# Patient Record
Sex: Female | Born: 1975 | Race: White | Hispanic: No | Marital: Single | Smoking: Never smoker
Health system: Southern US, Community
[De-identification: ages and names within clinical notes are randomized; demographics above are authoritative.]

## PROBLEM LIST (undated history)

## (undated) DIAGNOSIS — R519 Headache, unspecified: Secondary | ICD-10-CM

## (undated) DIAGNOSIS — D352 Benign neoplasm of pituitary gland: Secondary | ICD-10-CM

## (undated) DIAGNOSIS — F32A Depression, unspecified: Secondary | ICD-10-CM

## (undated) DIAGNOSIS — K648 Other hemorrhoids: Secondary | ICD-10-CM

## (undated) DIAGNOSIS — D353 Benign neoplasm of craniopharyngeal duct: Secondary | ICD-10-CM

## (undated) DIAGNOSIS — H60509 Unspecified acute noninfective otitis externa, unspecified ear: Secondary | ICD-10-CM

## (undated) DIAGNOSIS — R51 Headache: Secondary | ICD-10-CM

## (undated) DIAGNOSIS — E079 Disorder of thyroid, unspecified: Secondary | ICD-10-CM

## (undated) DIAGNOSIS — H6120 Impacted cerumen, unspecified ear: Secondary | ICD-10-CM

## (undated) DIAGNOSIS — B351 Tinea unguium: Secondary | ICD-10-CM

## (undated) DIAGNOSIS — I6350 Cerebral infarction due to unspecified occlusion or stenosis of unspecified cerebral artery: Secondary | ICD-10-CM

## (undated) DIAGNOSIS — E119 Type 2 diabetes mellitus without complications: Secondary | ICD-10-CM

## (undated) DIAGNOSIS — F329 Major depressive disorder, single episode, unspecified: Secondary | ICD-10-CM

## (undated) DIAGNOSIS — Z8701 Personal history of pneumonia (recurrent): Secondary | ICD-10-CM

## (undated) HISTORY — DX: Headache, unspecified: R51.9

## (undated) HISTORY — DX: Type 2 diabetes mellitus without complications: E11.9

## (undated) HISTORY — DX: Disorder of thyroid, unspecified: E07.9

## (undated) HISTORY — DX: Tinea unguium: B35.1

## (undated) HISTORY — DX: Other hemorrhoids: K64.8

## (undated) HISTORY — DX: Benign neoplasm of pituitary gland: D35.2

## (undated) HISTORY — DX: Impacted cerumen, unspecified ear: H61.20

## (undated) HISTORY — DX: Benign neoplasm of craniopharyngeal duct: D35.3

## (undated) HISTORY — DX: Depression, unspecified: F32.A

## (undated) HISTORY — DX: Major depressive disorder, single episode, unspecified: F32.9

## (undated) HISTORY — DX: Cerebral infarction due to unspecified occlusion or stenosis of unspecified cerebral artery: I63.50

## (undated) HISTORY — DX: Unspecified acute noninfective otitis externa, unspecified ear: H60.509

## (undated) HISTORY — DX: Personal history of pneumonia (recurrent): Z87.01

## (undated) HISTORY — DX: Headache: R51

---
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
## 1998-02-18 ENCOUNTER — Encounter: Admission: RE | Admit: 1998-02-18 | Discharge: 1998-05-19 | Payer: Self-pay | Admitting: Neurology

## 2001-03-22 ENCOUNTER — Encounter: Payer: Self-pay | Admitting: Endocrinology

## 2001-03-22 ENCOUNTER — Ambulatory Visit (HOSPITAL_COMMUNITY): Admission: RE | Admit: 2001-03-22 | Discharge: 2001-03-22 | Payer: Self-pay | Admitting: Endocrinology

## 2002-05-02 ENCOUNTER — Encounter (INDEPENDENT_AMBULATORY_CARE_PROVIDER_SITE_OTHER): Payer: Self-pay | Admitting: Specialist

## 2002-05-02 ENCOUNTER — Ambulatory Visit (HOSPITAL_COMMUNITY): Admission: RE | Admit: 2002-05-02 | Discharge: 2002-05-03 | Payer: Self-pay | Admitting: Otolaryngology

## 2002-07-09 ENCOUNTER — Encounter: Payer: Self-pay | Admitting: Preventative Medicine

## 2002-07-09 ENCOUNTER — Ambulatory Visit (HOSPITAL_COMMUNITY): Admission: RE | Admit: 2002-07-09 | Discharge: 2002-07-09 | Payer: Self-pay | Admitting: Preventative Medicine

## 2004-04-12 ENCOUNTER — Ambulatory Visit (HOSPITAL_COMMUNITY): Admission: RE | Admit: 2004-04-12 | Discharge: 2004-04-12 | Payer: Self-pay | Admitting: Endocrinology

## 2005-05-29 ENCOUNTER — Ambulatory Visit: Payer: Self-pay | Admitting: Family Medicine

## 2006-02-01 ENCOUNTER — Ambulatory Visit: Payer: Self-pay | Admitting: Obstetrics & Gynecology

## 2006-02-01 ENCOUNTER — Encounter: Payer: Self-pay | Admitting: Obstetrics & Gynecology

## 2006-02-26 ENCOUNTER — Ambulatory Visit: Payer: Self-pay | Admitting: Family Medicine

## 2006-03-14 ENCOUNTER — Ambulatory Visit (HOSPITAL_COMMUNITY): Admission: RE | Admit: 2006-03-14 | Discharge: 2006-03-14 | Payer: Self-pay | Admitting: Endocrinology

## 2006-06-19 ENCOUNTER — Ambulatory Visit: Payer: Self-pay | Admitting: Internal Medicine

## 2006-11-22 DIAGNOSIS — D352 Benign neoplasm of pituitary gland: Secondary | ICD-10-CM

## 2006-11-22 DIAGNOSIS — D353 Benign neoplasm of craniopharyngeal duct: Secondary | ICD-10-CM

## 2006-11-22 DIAGNOSIS — I6350 Cerebral infarction due to unspecified occlusion or stenosis of unspecified cerebral artery: Secondary | ICD-10-CM | POA: Insufficient documentation

## 2006-11-22 DIAGNOSIS — Z8709 Personal history of other diseases of the respiratory system: Secondary | ICD-10-CM | POA: Insufficient documentation

## 2007-05-07 ENCOUNTER — Ambulatory Visit: Payer: Self-pay | Admitting: Family Medicine

## 2007-05-07 DIAGNOSIS — B351 Tinea unguium: Secondary | ICD-10-CM | POA: Insufficient documentation

## 2007-05-07 DIAGNOSIS — H60509 Unspecified acute noninfective otitis externa, unspecified ear: Secondary | ICD-10-CM

## 2007-05-07 LAB — CONVERTED CEMR LAB
ALT: 45 units/L — ABNORMAL HIGH (ref 0–35)
AST: 30 units/L (ref 0–37)

## 2007-05-08 ENCOUNTER — Encounter (INDEPENDENT_AMBULATORY_CARE_PROVIDER_SITE_OTHER): Payer: Self-pay | Admitting: *Deleted

## 2007-06-14 ENCOUNTER — Ambulatory Visit: Payer: Self-pay | Admitting: Family Medicine

## 2007-06-24 ENCOUNTER — Ambulatory Visit: Payer: Self-pay | Admitting: Family Medicine

## 2007-08-12 ENCOUNTER — Encounter: Payer: Self-pay | Admitting: Obstetrics & Gynecology

## 2007-08-12 ENCOUNTER — Ambulatory Visit: Payer: Self-pay | Admitting: Gynecology

## 2007-09-24 ENCOUNTER — Encounter: Admission: RE | Admit: 2007-09-24 | Discharge: 2007-09-24 | Payer: Self-pay | Admitting: Endocrinology

## 2008-05-21 ENCOUNTER — Ambulatory Visit: Payer: Self-pay | Admitting: Family Medicine

## 2008-08-28 LAB — HM PAP SMEAR

## 2008-09-29 ENCOUNTER — Ambulatory Visit (HOSPITAL_COMMUNITY): Admission: RE | Admit: 2008-09-29 | Discharge: 2008-09-29 | Payer: Self-pay | Admitting: Endocrinology

## 2009-02-25 ENCOUNTER — Ambulatory Visit: Payer: Self-pay | Admitting: Family Medicine

## 2009-02-25 DIAGNOSIS — H6120 Impacted cerumen, unspecified ear: Secondary | ICD-10-CM

## 2009-06-10 ENCOUNTER — Ambulatory Visit: Payer: Self-pay | Admitting: Family Medicine

## 2009-08-17 ENCOUNTER — Ambulatory Visit: Payer: Self-pay | Admitting: Gynecology

## 2009-08-17 ENCOUNTER — Other Ambulatory Visit: Admission: RE | Admit: 2009-08-17 | Discharge: 2009-08-17 | Payer: Self-pay | Admitting: Gynecology

## 2009-09-09 ENCOUNTER — Ambulatory Visit: Payer: Self-pay | Admitting: Family Medicine

## 2009-09-27 ENCOUNTER — Telehealth: Payer: Self-pay | Admitting: Family Medicine

## 2009-10-25 ENCOUNTER — Ambulatory Visit: Payer: Self-pay | Admitting: Internal Medicine

## 2009-12-07 ENCOUNTER — Ambulatory Visit: Payer: Self-pay | Admitting: Family Medicine

## 2010-03-01 ENCOUNTER — Ambulatory Visit: Payer: Self-pay | Admitting: Family Medicine

## 2010-05-20 ENCOUNTER — Ambulatory Visit: Payer: Self-pay | Admitting: Internal Medicine

## 2010-06-01 ENCOUNTER — Ambulatory Visit: Payer: Self-pay | Admitting: Family Medicine

## 2010-06-06 ENCOUNTER — Encounter: Admission: RE | Admit: 2010-06-06 | Discharge: 2010-06-06 | Payer: Self-pay | Admitting: Endocrinology

## 2010-06-15 ENCOUNTER — Ambulatory Visit: Payer: Self-pay | Admitting: Internal Medicine

## 2010-06-15 DIAGNOSIS — K648 Other hemorrhoids: Secondary | ICD-10-CM | POA: Insufficient documentation

## 2010-07-01 ENCOUNTER — Encounter: Payer: Self-pay | Admitting: Family Medicine

## 2010-07-26 ENCOUNTER — Encounter: Payer: Self-pay | Admitting: Family Medicine

## 2010-08-31 ENCOUNTER — Ambulatory Visit: Admit: 2010-08-31 | Payer: Self-pay | Admitting: Family Medicine

## 2010-09-06 ENCOUNTER — Other Ambulatory Visit: Payer: Self-pay | Admitting: Family Medicine

## 2010-09-06 ENCOUNTER — Ambulatory Visit
Admission: RE | Admit: 2010-09-06 | Discharge: 2010-09-06 | Payer: Self-pay | Source: Home / Self Care | Attending: Family Medicine | Admitting: Family Medicine

## 2010-09-06 DIAGNOSIS — R5383 Other fatigue: Secondary | ICD-10-CM

## 2010-09-06 DIAGNOSIS — R5381 Other malaise: Secondary | ICD-10-CM | POA: Insufficient documentation

## 2010-09-06 LAB — B12 AND FOLATE PANEL
Folate: 9.6 ng/mL
Vitamin B-12: 370 pg/mL (ref 211–911)

## 2010-09-06 LAB — CBC WITH DIFFERENTIAL/PLATELET
Basophils Absolute: 0 10*3/uL (ref 0.0–0.1)
Basophils Relative: 0.3 % (ref 0.0–3.0)
Eosinophils Absolute: 0.2 10*3/uL (ref 0.0–0.7)
Eosinophils Relative: 2.2 % (ref 0.0–5.0)
HCT: 41.9 % (ref 36.0–46.0)
Hemoglobin: 14.4 g/dL (ref 12.0–15.0)
Lymphocytes Relative: 38.1 % (ref 12.0–46.0)
Lymphs Abs: 3.9 10*3/uL (ref 0.7–4.0)
MCHC: 34.4 g/dL (ref 30.0–36.0)
MCV: 95.1 fl (ref 78.0–100.0)
Monocytes Absolute: 0.8 10*3/uL (ref 0.1–1.0)
Monocytes Relative: 7.5 % (ref 3.0–12.0)
Neutro Abs: 5.3 10*3/uL (ref 1.4–7.7)
Neutrophils Relative %: 51.9 % (ref 43.0–77.0)
Platelets: 248 10*3/uL (ref 150.0–400.0)
RBC: 4.41 Mil/uL (ref 3.87–5.11)
RDW: 12.2 % (ref 11.5–14.6)
WBC: 10.1 10*3/uL (ref 4.5–10.5)

## 2010-09-29 NOTE — Assessment & Plan Note (Signed)
Summary: LETVAK FLU SHOT/RBH  Nurse Visit   Allergies: 1)  ! Codeine  Immunizations Administered:  Influenza Vaccine # 1:    Vaccine Type: Fluvax 3+    Site: left deltoid    Mfr: GlaxoSmithKline    Dose: 0.5 ml    Route: IM    Given by: Mervin Hack CMA (AAMA)    Exp. Date: 02/25/2011    Lot #: [id number]    VIS given: 03/22/10 version given May 20, 2010.  Flu Vaccine Consent Questions:    Do you have a history of severe allergic reactions to this vaccine? no    Any prior history of allergic reactions to egg and/or gelatin? no    Do you have a sensitivity to the preservative Thimersol? no    Do you have a past history of Guillan-Barre Syndrome? no    Do you currently have an acute febrile illness? no    Have you ever had a severe reaction to latex? no    Vaccine information given and explained to patient? yes    Are you currently pregnant? no  Orders Added: 1)  Flu Vaccine 58yrs + [90658] 2)  Admin 1st Vaccine [09811]

## 2010-09-29 NOTE — Assessment & Plan Note (Signed)
Summary: HEMROIDS/DLO   Vital Signs:  Patient profile:   35 year old female Height:      67.5 inches Weight:      274.50 pounds BMI:     42.51 Temp:     98.2 degrees F oral Pulse rate:   80 / minute Pulse rhythm:   regular BP sitting:   110 / 70  (left arm) Cuff size:   large  Vitals Entered By: Selena Batten Dance CMA Duncan Dull) (June 15, 2010 10:36 AM) CC: Hemmorhoids   History of Present Illness: CC: hemorrhoids  never been told had hemorrhoids but pt thinks may be the case.  Last GYN visit no ext hemorrhoids seen.  last week started with hemorrhoid, very tender.  No bleeding.  Mom has been using tucks pads, wipes, preparation H.    Stools - pretty firm stools, goes every day.  2-3 stools/day.  Has firm balls with stool.  Allergies: 1)  ! 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  NAD Rectal:  inflammed prolapsed L internal hemorrhoid.  too tender to reduce.  not thrombosed.   Impression & Recommendations:  Problem # 1:  HEMORRHOIDS, INTERNAL (ICD-455.0) prolapsed.  Discussed could try to reduce, pt prefers more definitive treatment.  referral to surgery for evaluation.  rec colace daily, sitz baths and steroid suppository  Orders: Surgical Referral (Surgery)  Complete Medication List: 1)  Vytorin 10-40 Mg Tabs (Ezetimibe-simvastatin) .Marland Kitchen.. 1 by mouth once daily 2)  Cabergoline 0.5 Mg Tabs (Cabergoline) .... Mondays and thursdays 3)  Levothyroxine Sodium 150 Mcg Tabs (Levothyroxine sodium) .Marland Kitchen.. 1 by mouth once daily 4)  Fish Oil 1200 Mg Caps (Omega-3 fatty acids) .Marland Kitchen.. 1 by mouth two times a day 5)  Topamax 25 Mg Tabs (Topiramate) .... 2 by mouth every morning and 2 by  mouth each evening 6)  Verapamil Hcl Cr 240 Mg Cp24 (Verapamil hcl) .... 2 by mouth once daily 7)  Ec-naprosyn 500 Mg Tbec (Naproxen) .... As needed 8)  Nystatin 100000 Unit/gm Crea (Nystatin) .... Apply to affected area two times a day, continue for at least 48 hours after rash resolved 9)  Cymbalta 30 Mg Cpep (Duloxetine hcl) .... Take 1 by mouth once daily 10)  Anucort-hc 25 Mg Supp (Hydrocortisone acetate) .... Suppository twice daily for next week  Patient Instructions: 1)  start colace daily - goal is one soft stool a day.  More water, more fiber, consider benefiber or metamucil. 2)  We will send you to see the surgeon hopefully soon. 3)  Steroid cream for next few days as well as sitting in warm water bath. 4)  Good to meet you today, call clinic with questions. 5)  Pass by Marion's office for referral. Prescriptions: ANUCORT-HC 25 MG SUPP (HYDROCORTISONE ACETATE) suppository twice daily for next week  #1 x 0   Entered and Authorized by:   Eustaquio Boyden  MD   Signed by:   Eustaquio Boyden  MD on 06/15/2010   Method used:   Print then Give to Patient   RxID:   [id number]    Orders Added: 1)  Est. Patient Level III [32440] 2)  Surgical Referral [Surgery]    Current Allergies (  reviewed today): ! CODEINE

## 2010-09-29 NOTE — Assessment & Plan Note (Signed)
Summary: DEPO SHOT/CLE  Nurse Visit   Allergies: 1)  ! Codeine  Medication Administration  Injection # 1:    Medication: Depo-Provera 150mg     Diagnosis: CONTRACEPTIVE MANAGEMENT (ICD-V25.09)    Route: IM    Site: R deltoid    Exp Date: 09/28/2012    Lot #: [id number]    Mfr: Francisca December    Comments: Patient requested that the injection be given in her arm instead of buttocks.    Patient tolerated injection without complications    Given by: Linde Gillis CMA Duncan Dull) (March 01, 2010 12:44 PM)  Orders Added: 1)  Depo-Provera 150mg  [J1055] 2)  Admin of Therapeutic Inj  intramuscular or subcutaneous [47829]

## 2010-09-29 NOTE — Assessment & Plan Note (Signed)
Summary: EAR/CLE   Vital Signs:  Patient profile:   35 year old female Weight:      283 pounds Temp:     99.1 degrees F oral BP sitting:   120 / 80  (left arm) Cuff size:   large  Vitals Entered By: Mervin Hack CMA Duncan Dull) (October 25, 2009 12:32 PM) CC: left ear pain   History of Present Illness: Having left ear pain started last night mild fever sister tried left over ear drops and naproxen--able to get back to sleep  Does swim regularly tends to over use Q-tips  No discharge No cough or rhinorrhea No cold symptoms  Allergies: 1)  ! Codeine  Past History:  Past medical, surgical, family and social histories (including risk factors) reviewed for relevance to current acute and chronic problems.  Past Medical History: Reviewed history from 11/22/2006 and no changes required. Pneumonia, hx of  Past Surgical History: Reviewed history from 11/22/2006 and no changes required. 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  Social History: Reviewed history and no changes required.  Review of Systems       canker sore last week--better now No GI symptoms  Physical Exam  General:  alert.  NAD Head:  no sinus tenderness Ears:  R ear normal.    Mild left tragal tenderness Mild canal injeciton on left TM normal no discharge Nose:  mild inflammation Mouth:  no erythema and no exudates.   Neck:  supple and no cervical lymphadenopathy.     Impression & Recommendations:  Problem # 1:  OTITIS EXTERNA, ACUTE NEC (ICD-380.22) Assessment Comment Only seems to have mild exacerbation will use the left over drops and NSAID  will send refill if running low discussed prophylaxis but it seems to be rare  Complete Medication List: 1)  Vytorin 10-40 Mg Tabs (Ezetimibe-simvastatin) .Marland Kitchen.. 1 by mouth once daily 2)   Cabergoline 0.5 Mg Tabs (Cabergoline) .... Mondays and thursdays 3)  Levothyroxine Sodium 150 Mcg Tabs (Levothyroxine sodium) .Marland Kitchen.. 1 by mouth once daily 4)  Fish Oil 1200 Mg Caps (Omega-3 fatty acids) .Marland Kitchen.. 1 by mouth two times a day 5)  Topamax 25 Mg Tabs (Topiramate) .... 2 by mouth every morning and 2 by mouth each evening 6)  Verapamil Hcl Cr 240 Mg Cp24 (Verapamil hcl) .... 2 by mouth once daily 7)  Ec-naprosyn 500 Mg Tbec (Naproxen) .... As needed 8)  Nystatin 100000 Unit/gm Crea (Nystatin) .... Apply to affected area two times a day, continue for at least 48 hours after rash resolved 9)  Cymbalta 30 Mg Cpep (Duloxetine hcl) .... Take 1 by mouth once daily  Patient Instructions: 1)  Please schedule a follow-up appointment as needed .   Current Allergies (reviewed today): ! CODEINE

## 2010-09-29 NOTE — Assessment & Plan Note (Signed)
Summary: DEPO SHOT  Nurse Visit   Allergies: 1)  ! Codeine  Medication Administration  Injection # 1:    Medication: Depo-Provera 150mg     Diagnosis: CONTRACEPTIVE MANAGEMENT (ICD-V25.09)    Route: IM    Site: L deltoid    Exp Date: 10/25/2012    Lot #: [id number]    Mfr: [email or web address]    Patient tolerated injection without complications    Given by: Delilah Shan CMA (AAMA) (June 01, 2010 9:49 AM)  Orders Added: 1)  Admin of Therapeutic Inj  intramuscular or subcutaneous [96372] 2)  Depo-Provera 150mg  [J1055]   Medication Administration  Injection # 1:    Medication: Depo-Provera 150mg     Diagnosis: CONTRACEPTIVE MANAGEMENT (ICD-V25.09)    Route: IM    Site: L deltoid    Exp Date: 10/25/2012    Lot #: [id number]    Mfr: [email or web address]    Patient tolerated injection without complications    Given by: Delilah Shan CMA (AAMA) (June 01, 2010 9:49 AM)  Orders Added: 1)  Admin of Therapeutic Inj  intramuscular or subcutaneous [96372] 2)  Depo-Provera 150mg  [J1055]

## 2010-09-29 NOTE — Assessment & Plan Note (Signed)
Summary: depo shot bringing her own med. appt 12:45  Nurse Visit   Allergies: 1)  ! Codeine  Medication Administration  Injection # 1:    Medication: Depo-Provera 150mg     Route: IM    Site: LUOQ gluteus    Exp Date: 12-2011    Lot #: [id number]    Mfr: [email or web address]    Comments: Patient brought medication    Patient tolerated injection without complications    Given by: Benny Lennert CMA Duncan Dull) (September 09, 2009 12:44 PM)  Orders Added: 1)  Admin of Therapeutic Inj  intramuscular or subcutaneous [16109]

## 2010-09-29 NOTE — Consult Note (Signed)
Summary: CCS, excised thrombosed ext hemorrhoid  [facility]   Imported By: Maryln Gottron 07/22/2010 13:29:31  _____________________________________________________________________  External Attachment:    Type:   Image     Comment:   External Document

## 2010-09-29 NOTE — Progress Notes (Signed)
  Phone Note Call from Patient   Caller: Mom Summary of Call: Can Gara have a RX for Nystatin cream? She usually gets it from old GYN but she no longer sees them. She gets infection under her tummy. [facility] [place]. Thanks, Terri Initial call taken by: Mills Koller,  September 27, 2009 3:26 PM  Follow-up for Phone Call        Let me know if rash not improving with this. Follow-up by: Kerby Nora MD,  September 27, 2009 3:32 PM  Additional Follow-up for Phone Call Additional follow up Details #1::        ok thank you Additional Follow-up by: Mills Koller,  September 27, 2009 3:53 PM    New/Updated Medications: NYSTATIN 100000 UNIT/GM CREA (NYSTATIN) Apply to affected area two times a day, continue for at least 48 hours after rash resolved Prescriptions: NYSTATIN 100000 UNIT/GM CREA (NYSTATIN) Apply to affected area two times a day, continue for at least 48 hours after rash resolved  #30-60 gm x 3   Entered and Authorized by:   Kerby Nora MD   Signed by:   Kerby Nora MD on 09/27/2009   Method used:   [facility]        [facility]  [address]retail)       [address]       [place], [place]  [address]       Ph: [phone] or [phone]       Fax: [phone]   RxID:   [id number]

## 2010-09-29 NOTE — Consult Note (Signed)
Summary: [facility]   Imported By: Lanelle Bal 08/30/2010 09:31:23  _____________________________________________________________________  External Attachment:    Type:   Image     Comment:   External Document

## 2010-09-29 NOTE — Assessment & Plan Note (Signed)
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 Also has history of pituitary  tumor and hydrocephaly age 25 She presents with extreme fatigue over the past 1-2 months. Often she does not get out of bed...not nearly as active as she normally she is. No pain. Recent cold with congestion, now resolving.  EatIng ALOT. HAs been on Depo for the last year.. for dysmennorhea. No flu like illness.    In July saw her endocrinologist.. increase in thyroid medicaine.Marland Kitchen HAs upcoming appt in end of this month. Nml CME, nml sugar, TSh.Marland Kitchen 0.48.Marland Kitchen increased dose at last OV.  Prolactin was nml. 6 months ago started on crestor.  Mood.. on cymbalta... per MOM helped a lot initially.          Sees GYN for CPX.   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 Mondays and Thursdays 2)  Levothroid 175 Mcg Tabs (Levothyroxine Sodium) .... Take One Tablet Daily 3)  Fish Oil 1200 Mg  Caps (Omega-3 Fatty Acids) .Marland Kitchen.. 1 By Mouth Two Times A Day 4)  Topamax 25 Mg  Tabs (Topiramate) .... 2 By Mouth Every Morning and 2  By Mouth Each Evening 5)  Verapamil Hcl Cr 240 Mg  Cp24 (Verapamil Hcl) .... 2 By Mouth Once Daily 6)  Ec-Naprosyn 500 Mg  Tbec (Naproxen) .... As Needed 7)  Cymbalta 30 Mg Cpep (Duloxetine Hcl) .... Take 1 By Mouth Once Daily 8)  [facility]vs Fiber Gummies 2.5 Gm Chew (Fiber) .... Take One Gummies Daily 9)  Flax Seed Oil 1000 Mg Caps (Flaxseed (Linseed)) .... Take One Tablet Daily 10)  Stool Softener 100 Mg Caps (Docusate Sodium) .... Take One Tablet Dialy 11)  Zinc 50 Mg Tabs (Zinc) .... Take One Tablet Daily 12)  Crestor 10 Mg Tabs (Rosuvastatin Calcium) .... Take One Tablet On Monday and Thursday  Allergies: 1)  ! Codeine  Past History:  Past medical, surgical, family and social histories (including risk factors) reviewed, and no changes noted (except as noted below).  Past Medical History: Reviewed history from 11/22/2006 and no changes required. Pneumonia, hx of  Past Surgical History: Reviewed history from 11/22/2006 and no changes required. Removal pituitary tumor---age 31 Hydrocephalic with shunt placed --age 61 Ear tubes bilat and adenoidectomy --age 42  Tonsilectomy  57  Family History: Reviewed history and no changes required.  Social History: Reviewed history and no changes required.  Review of Systems General:  Complains of fatigue;  denies fever. CV:  Complains of chest pain or discomfort. Resp:  Denies shortness of breath. GI:  Complains of constipation; denies bloody stools and diarrhea; Did have hemmorhoid  On stool softners and fiber gummies.. GU:  Denies abnormal vaginal bleeding and dysuria.  Physical Exam  General:  obese appearing female inNAD  Eyes:  No corneal or conjunctival inflammation noted. EOMI. Perrla. Funduscopic exam benign, without hemorrhages, exudates or papilledema. Vision grossly normal. Ears:  External ear exam shows no significant lesions or deformities.  Otoscopic examination reveals clear canals, tympanic membranes are intact bilaterally  without bulging, retraction, inflammation or discharge. Hearing is grossly normal bilaterally. Nose:  External nasal examination shows no deformity or inflammation. Nasal mucosa are pink and moist without lesions or exudates. Mouth:  Oral mucosa and oropharynx without lesions or exudates.  Teeth in good repair. Neck:  no carotid bruit or thyromegaly no cervical or supraclavicular lymphadenopathy  Lungs:  Normal respiratory effort, chest expands symmetrically. Lungs are clear to auscultation, no crackles or wheezes. Heart:  Normal rate and regular rhythm. S1 and S2 normal without gallop, murmur, click, rub or other extra sounds. Abdomen:  Bowel sounds positive,abdomen soft and non-tender without masses, organomegaly or hernias noted. Pulses:  R and L posterior tibial pulses are full and equal bilaterally  Extremities:  no edema  Skin:  Intact without suspicious lesions or rashes Psych:  Cognition and judgment appear intact. Alert and cooperative with normal attention span and concentration. No apparent delusions, illusions, hallucinations   Impression & Recommendations:  Problem # 1:  FATIGUE (ICD-780.79) Has appt in next week with ENDO for recheck of prolactin and thyroid.  No clear etiology of fatigue other than possibly due to Depo, weight gain and inactivity. No suggestion of cardiac or pulmonary issues.  She is on multiple sedaitng meds.   Possibly due to depression as well, although not clearly poor control per Mom...can consider increase in cymbaltta as needed.  Start with cbc  and B12 eval.  If fatigue not improving off Depo and with weight loss and exercise.. can consider further work up.   Complete Medication List: 1)  Cabergoline 0.5 Mg Tabs (Cabergoline) .... Mondays and thursdays 2)  Levothroid 175 Mcg Tabs (Levothyroxine sodium) .... Take one tablet daily 3)  Fish Oil 1200 Mg Caps (Omega-3 fatty acids) .Marland Kitchen.. 1 by mouth two times a day 4)  Topamax 25 Mg Tabs (Topiramate) .... 2  by mouth every morning and 2 by mouth each evening 5)  Verapamil Hcl Cr 240 Mg Cp24 (Verapamil hcl) .... 2 by mouth once daily 6)  Ec-naprosyn 500 Mg Tbec (Naproxen) .... As needed 7)  Cymbalta 30 Mg Cpep (Duloxetine hcl) .... Take 1 by mouth once daily 8)  Cvs Fiber Gummies 2.5 Gm Chew (Fiber) .... Take one gummies daily 9)  Flax Seed Oil 1000 Mg Caps (Flaxseed (linseed)) .... Take one tablet daily 10)  Stool Softener 100 Mg Caps (Docusate sodium) .... Take one tablet dialy 11)  Zinc 50 Mg Tabs (Zinc) .... Take one tablet daily 12)  Crestor 10 Mg Tabs (Rosuvastatin calcium) .... Take one tablet on monday and thursday  Other Orders: TLB-CBC Platelet - w/Differential (85025-CBCD) TLB-B12 + Folate Pnl ([id number]-B12/FOL)   Orders Added: 1)  Est. Patient Level III [81191] 2)  TLB-CBC Platelet - w/Differential [85025-CBCD] 3)  TLB-B12 + Folate Pnl [[id number]/FOL]    Current Allergies (reviewed today): ! CODEINE  PAP Result Date:  08/28/2008 PAP Result:  nml PAP Next Due:  1  yr

## 2010-09-29 NOTE — Assessment & Plan Note (Signed)
Summary: 12:30  DEPO SHOT/CLE  Nurse Visit   Allergies: 1)  ! Codeine  Medication Administration  Injection # 1:    Medication: Depo-Provera 150mg     Diagnosis: CONTRACEPTIVE MANAGEMENT (ICD-V25.09)    Route: IM    Site: RUOQ gluteus    Exp Date: 12/27/2011    Lot #: [id number]    Mfr: [place]    Comments: Patient provided her own medication    Patient tolerated injection without complications    Given by: Linde Gillis CMA Duncan Dull) (December 07, 2009 1:20 PM)  Orders Added: 1)  Depo-Provera 150mg  [J1055] 2)  Admin of patients own med IM/SQ [phone]

## 2011-01-10 NOTE — Assessment & Plan Note (Signed)
Christine Koch, Christine Koch             ACCOUNT NO.:  [id number]   MEDICAL RECORD NO.:  [id number]          PATIENT TYPE:  POB   LOCATION:  [facility]         FACILITY:  [facility]   PHYSICIAN:  Elsie Lincoln, MD      DATE OF BIRTH:  12-Feb-1976   DATE OF SERVICE:  08/12/2007                                  CLINIC NOTE   The patient is a 35 year old female who presents for yearly exam.  I met  her last year with her mother and her detailed first H&P is in the  chart. 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  She is quite humorous and seems to be functional, however, she made  several strong sexual references and I did speak with her mother and  caretaker about leaving her alone.  I think that she admits to having a  strong sexual desire and I think that she would have sex if left alone  with anyone she finds desirable.  She has had sex in the past but not  recently.  However, the patient thinks she has had sex this past year,  so I did do a Pap smear.  Her Pap smear was normal last year.  HPV was  negative.   PAST MEDICAL HISTORY:  1. Stroke.  2. Hypercholesterolemia now on a statin.  3. Hypothyroidism.  4. 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   PAST SURGICAL HISTORY:  No known surgeries.  The patient has had a  pituitary surgery, adenoids and tonsillectomy.   GYN HISTORY:  The patient has regular periods with no bleeding in-  between. Sexual activity as described above.  No issues to talk about.   FAMILY HISTORY:  No known cancers.   REVIEW OF SYSTEMS:  No new issues.   PHYSICAL EXAMINATION:  VITALS:  Blood pressure 113/67, weight 279, pulse  61.  GENERAL:  Well-nourished, well-developed, no apparent distress.  HEENT:  There are multiple nevi on her neck that appear darker than I  remember.  I will send her to a dermatologist for a full skin survey.  LUNGS:  Clear to auscultation bilaterally.  HEART:  Regular rate and rhythm.  BREASTS:  No  masses, nontender.  No lymphadenopathy.  ABDOMEN:  Soft, nontender, no hernia.  No organomegaly.  However,  difficult secondary to body habitus.  GENITALIA:  Tanner five.  Vagina pink normal rugae.  Cervix anterior,  nontender.  Uterus difficult to assess the very habitus but nontender.  Adnexa no masses, nontender, urethra and bladder nontender.  EXTREMITIES:  Nontender.   ASSESSMENT/PLAN:  A 35 year old female for Pap smear and yearly exam.  1. Pap smear done.  2. Skin survey for nevi.  3. The patient to be on birth control and use condoms if sexually      active in the future.  4. Follow up with medical doctor for hypercholesterolemia and      hypothyroidism.           ______________________________  Elsie Lincoln, MD     KL/MEDQ  D:  08/12/2007  T:  08/13/2007  Job:  [id number]

## 2011-01-13 NOTE — Op Note (Signed)
NAME:  Christine Koch, Christine Koch                       ACCOUNT NO.:  [id number]   MEDICAL RECORD NO.:  [id number]                   PATIENT TYPE:  OIB   LOCATION:  2899                                 FACILITY:  [facility]   PHYSICIAN:  Kinnie Scales. Annalee Genta, M.D.            DATE OF BIRTH:  09/10/1975   DATE OF PROCEDURE:  05/02/2002  DATE OF DISCHARGE:                                 OPERATIVE REPORT   PREOPERATIVE DIAGNOSES:  1. Recurrent acute tonsillitis.  2. Chronic tonsillitis.  3. Snoring with possible mild obstructive sleep apnea.  4. 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   POSTOPERATIVE DIAGNOSES:  1. Recurrent acute tonsillitis.  2. Chronic tonsillitis.  3. Snoring with possible mild obstructive sleep apnea.  4. 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   INDICATIONS FOR PROCEDURE:  1. Recurrent acute tonsillitis.  2. Chronic tonsillitis.  3. Snoring with possible mild obstructive sleep apnea.  4. 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   PROCEDURE:  Tonsillectomy.   ANESTHESIA:  General endotracheal.   SURGEON:  Kinnie Scales. Annalee Genta, M.D.   COMPLICATIONS:  None.   ESTIMATED BLOOD LOSS:  Minimal.   DISPOSITION:  Patient transferred from the operating room to the recovery  room in stable condition.   BRIEF HISTORY:  The patient is a 35 year old white female who was referred  for evaluation of recurrent acute tonsillitis and significant tonsillar  hypertrophy.  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  The patient is relatively functional, but concerns were raised regarding  possible mild obstructive sleep apnea in the face of tonsillar hypertrophy  and recurring infection.  Given her history and findings, I recommended that  we undertake tonsillectomy under general anesthesia.  The risks, benefits,  and  possible complications of this procedure were discussed in detail with  the patient and her mother, who understood and concurred with our plan for  surgery, which was scheduled as above at [facility] main OR.   DESCRIPTION OF PROCEDURE:  The patient was brought to the operating room on  05/02/02 and placed in the supine position on the operating table.  General  endotracheal anesthesia was established without difficulty, and the patient  was adequately anesthetized.  Her oral cavity and oropharynx were examined.  She was found to have 3+ cryptic tonsils without erythema or exudate, no  loose or broken teeth.  A Crowe-Davis mouth gag was inserted without  difficulty, and the surgical procedure was begun with tonsillectomy.  Using  a Harmonic scalpel and dissecting in a subcapsular fashion, the entire left  tonsil was removed from superior pole to tongue base.  The right tonsil was  removed in a similar fashion.  The tonsillar fossae were abraded with a dry  tonsillar sponge.  Several areas of point hemorrhage were cauterized using  suction cautery.  There was no active bleeding.  The patient's oral cavity  and oropharynx were then irrigated and suctioned, and an orogastric tube was  passed and the stomach contents were aspirated.  The Crowe-Davis mouth gag  was released and reapplied.  There was no active bleeding.  The gag was  removed.  No loose or broken teeth.  The patient was then awakened from her  anesthetic, extubated, and was transferred from the operating room to the  recovery room in stable condition.  There were no complications.  The blood  loss was minimal.                                                Onalee Hua L. Annalee Genta, M.D.    DLS/MEDQ  D:  91/47/8295  T:  05/02/2002  Job:  [id number]

## 2011-01-13 NOTE — Discharge Summary (Signed)
NAME:  Christine Koch, Christine Koch                       ACCOUNT NO.:  [id number]   MEDICAL RECORD NO.:  [id number]                   PATIENT TYPE:  OIB   LOCATION:  5739                                 FACILITY:  [facility]   PHYSICIAN:  Kinnie Scales. Annalee Genta, M.D.            DATE OF BIRTH:  November 01, 1975   DATE OF ADMISSION:  05/02/2002  DATE OF DISCHARGE:  05/03/2002                                 DISCHARGE SUMMARY   ADMISSION DIAGNOSES:  1. Recurrent acute tonsillitis.  2. Tonsillar hypertrophy.  3. Chronic cryptic tonsillitis.   DISCHARGE DIAGNOSES:  1. Recurrent acute tonsillitis.  2. Tonsillar hypertrophy.  3. Chronic cryptic tonsillitis.   SURGICAL PROCEDURE:  Tonsillectomy performed on May 02, 2002.   DISCHARGE MEDICATIONS:  1. The patient's preoperative medications including Synthroid.  2. Maxalt.  3. Topamax.  4. Verapamil.  5. Additionally Lortab Elixir two to three teaspoonfuls q.4-6h. p.r.n. pain,     dispense 300 cc without refills.  6. Augmentin Elixir 400 mg per 5 cc, two teaspoonfuls p.o. b.i.d.   ACTIVITY:  Limited, no lifting or straining.  No vigorous physical activity.   DIET:  Restricted to liquid and soft diet as tolerated.   FOLLOW UP:  The patient is scheduled to follow up in my office on May 23, 2002, for postoperative care, or sooner as warranted.   CONDITION ON DISCHARGE:  She is discharged home in stable condition in the  company of her mother, and will follow up as above.   HISTORY OF PRESENT ILLNESS:  The patient is a 35 year old white female who  was referred to my office for evaluation of recurrent tonsillitis, tonsillar  hypertrophy, and chronic cryptic tonsillitis.  The patient had a history  consistent with mild obstructive sleep apnea.  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   She showed significant tonsillar hypertrophy.   Would consider her for a  tonsillectomy under general anesthesia.  The risks, benefits, and possible  complications of this procedure were discussed in detail with the patient  and with her mother.  Given concerns about her sleep apnea and her  disability from prior CVA, the surgery was to be performed at the [facility].  [facility] Main Operating Room with a 23-hour observation in the  postoperative period.   HOSPITAL COURSE:  The patient is admitted to the ENT Service under Dr. Onalee Hua  L. Shoemaker's care on May 02, 2002.  She was taken to the operating  room at [facility] and underwent an uneventful  tonsillectomy.  She was transferred from the operating room to the recovery  room, and from the recovery room to unit 5700 for postoperative care.  Her  postoperative period progressed smoothly without complications or  difficulty.  The patient was tolerating a soft oral diet and liquids on the  evening of  her surgery.  Her pain was well-controlled using Lortab Elixir as  prescribed.  The patient had no bleeding, and examination on the first  postoperative morning showed no significant erythema, edema, or swelling,  and no evidence of infection.  The patient was afebrile.  She was ambulating  without difficulty and had normal bowel and bladder function.   DISPOSITION:  She was discharged to home in stable condition in the company  of her mother.                                                Kinnie Scales. Annalee Genta, M.D.    DLS/MEDQ  D:  16/05/9603  T:  05/05/2002  Job:  [id number]

## 2011-01-13 NOTE — Group Therapy Note (Signed)
NAMEBILLI, Christine Koch NO.:  [id number]   MEDICAL RECORD NO.:  [id number]          PATIENT TYPE:  WOC   LOCATION:  [facility]                   FACILITY:  [facility]   PHYSICIAN:  Elsie Lincoln, MD      DATE OF BIRTH:  03-Aug-1976   DATE OF SERVICE:  02/01/2006                                    CLINIC NOTE   The patient is a 35 year old nulliparous female, LMP Jan 09, 2006, who  presents for Pap smear. The patient was seen at [facility]. The patient  was only complaining of hemorrhage that she had about a month ago that bled  a lot. It got much better with Tuck's. The patient is not sexually active,  but has been sexually active in the past. 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 Her mother is here today and gives good history for her.   PAST MEDICAL HISTORY:  1.  Stroke as described above.  2.  Increased cholesterol controlled with Salmon oil .  3.  Hypothyroidism.  4.  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   The patient is followed by Dr. __________ for significant problems.   PAST SURGICAL HISTORY:  1.  Pituitary surgery as described above.  2.  Adenoids as an infant.  3.  Tonsillectomy four years ago.   GYNECOLOGIC HISTORY:  Not currently sexually active. No history of ovarian  cyst, fibroid tumor, or sexually transmitted disease. She says she has  regular menses.   FAMILY HISTORY:  Significant for diabetes and Dr. Evlyn Kanner is following this.  No history of ovarian cancer, colon cancer, or breast cancer.   REVIEW OF SYSTEMS:  Negative except for weight gain and hemorrhoids. The  patient also complains of breast tenderness and maybe a lump felt in the  outer quadrant of both breasts. It is associated mainly with her menses.   PHYSICAL EXAMINATION:  VITAL SIGNS: Weight 276, blood pressure 104/71, pulse  61.  GENERAL: Obese, well-nourished, well-developed, no  apparent distress.  HEENT: Slight hair thinning; the patient wears glasses.  NECK: Thyroid has no masses, not enlarged.  LUNGS: Clear to auscultation bilaterally.  HEART: Regular rate and rhythm.  BREASTS: Some tenderness in the outer quadrant on the right hand side. No  masses felt. No lymphadenopathy.  ABDOMEN: Soft, nontender, no hemorrhoids, no organomegaly. However difficult  exam secondary to body habitus.  GENITALIA: Tanner 5. Some evidence of maybe some yeast or wetness, however  the patient has no complaint of itching. Cervix closed, nontender. Uterus  and adnexa difficult to palpate secondary to habitus and no masses or  tenderness. Urethra and bladder are nontender.  RECTAL EXAM: No hemorrhoids felt externally; questionably a small hemorrhoid  inside. A bulging vessels, but nothing thrombosed or enlarged.  EXTREMITIES: Nontender.   ASSESSMENT/PLAN:  A 35 year old female for Pap smear.  1.  Pap smear. HPV culture. If both are negative, and high-risk HPV negative      then Pap smears two to three years.  2.  Obesity. This is being addressed per endocrinologist.  Her mother has her      exercise five times a week.  3.  Hemorrhoids. Not a problem at this time.  4.  Return to clinic in three years for Pap smear if all is  negative.  5.  Patient is to call when her menses starts or when she has one of these      breasts cysts removed or breast ultrasound.           ______________________________  Elsie Lincoln, MD     KL/MEDQ  D:  02/01/2006  T:  02/02/2006  Job:  [id number]

## 2011-02-13 ENCOUNTER — Encounter: Payer: Self-pay | Admitting: Family Medicine

## 2011-02-13 ENCOUNTER — Ambulatory Visit (INDEPENDENT_AMBULATORY_CARE_PROVIDER_SITE_OTHER): Payer: Medicaid Other | Admitting: Family Medicine

## 2011-02-13 VITALS — BP 110/74 | HR 72 | Temp 98.3°F | Wt 284.0 lb

## 2011-02-13 DIAGNOSIS — H60399 Other infective otitis externa, unspecified ear: Secondary | ICD-10-CM

## 2011-02-13 DIAGNOSIS — H6093 Unspecified otitis externa, bilateral: Secondary | ICD-10-CM | POA: Insufficient documentation

## 2011-02-13 MED ORDER — CIPROFLOXACIN-HYDROCORTISONE 0.2-1 % OT SUSP: 3.0000 [drp] | Freq: Two times a day (BID) | OTIC | Status: DC

## 2011-02-13 NOTE — Progress Notes (Signed)
  Subjective:    Patient ID: Christine Koch, female    DOB: Dec 12, 1975, 35 y.o.   MRN: [id number]  HPI CC: earache  2 d L>R ear ache, goes to pool every day.  Scratching ear.  Worse pain this am.  Cleans with qtip and sometimes bobby pin.  Endorses some drainage from ear, watery.  Denies fevers/chills, cold sxs, RN, congestion.  Hasn't been taking anything for pain.  Denies hearing changes or ringing in ears.  Using ear drops prescribed by Dr. Leonard Schwartz previously (cortisporin).  Review of Systems Per HPI    Objective:   Physical Exam  Nursing note and vitals reviewed. Constitutional: She appears well-developed and well-nourished. No distress.  HENT:  Head: Normocephalic and atraumatic.  Right Ear: Hearing, tympanic membrane and external ear normal.  Left Ear: Hearing normal. There is swelling and tenderness. No mastoid tenderness.  Mouth/Throat: Oropharynx is clear and moist. No oropharyngeal exudate.       L ear: outer canal with swelling and pale cerumen debris.  TM erythematous and cloudy, unable to appreciate perf but difficult given debris.  + pain with pulling of pinna and at tragus, no mastoid tenderness R ear: some debris, no perf.  TM clear.  Eyes: Conjunctivae and EOM are normal. Pupils are equal, round, and reactive to light. No scleral icterus.  Neck: Normal range of motion. Neck supple.  Lymphadenopathy:       Head (right side): No submental, no submandibular, no tonsillar, no preauricular, no posterior auricular and no occipital adenopathy present.       Head (left side): No submental, no submandibular, no tonsillar, no preauricular, no posterior auricular and no occipital adenopathy present.    She has no cervical adenopathy.          Assessment & Plan:

## 2011-02-13 NOTE — Assessment & Plan Note (Signed)
L>R. Treat with abx ear drops, cannot fully r/o perf. Ibuprofen for pain. Update [place] if red flags.

## 2011-02-13 NOTE — Patient Instructions (Signed)
Looks like outer ear infection. Treat with antibiotic ear drops (sent to pharmacy). Stay out of pool/water until feeling better. Ibuprofen or alleve for pain.  Swimmer's Ear (Otitis Externa) Otitis externa ("swimmer's ear") is a germ (bacterial) or fungal infection of the outer ear canal (from the eardrum to the outside of the ear). Swimming in dirty water may cause swimmer's ear. It also may be caused by moisture in the ear from water remaining after swimming or bathing. Often the first signs of infection may be itching in the ear canal. This may progress to ear canal swelling, redness, and pus drainage which may be signs of infection. HOME CARE INSTRUCTIONS  Apply the antibiotic drops to the ear canal as prescribed by your doctor.   This can be a very painful medical condition. A strong pain reliever may be prescribed.   Only take over-the-counter or prescription medicines for pain, discomfort, or fever as directed by your caregiver.   If your caregiver has given you a follow-up appointment, it is very important to keep that appointment. Not keeping the appointment could result in a chronic or permanent injury, pain, hearing loss and disability. If there is any problem keeping the appointment, you must call back to this facility for assistance.  PREVENTION  It is important to keep your ear dry. Use the corner of a towel to wick water out of the ear canal after swimming or bathing.   Avoid scratching in your ear. This can damage the ear canal or remove the protective wax lining the canal and make it easier for germs (bacteria) or a fungus to grow.   You may use ear drops made of rubbing alcohol and vinegar after swimming to prevent future "swimmer ear" infections. Make up a small bottle of equal parts white vinegar and alcohol. Put 3 or 4 drops into each ear after swimming.   Avoid swimming in lakes, polluted water, or poorly chlorinated pools.  SEEK MEDICAL CARE IF:  An oral temperature  above 101 develops.   Your ear is still painful after 3 days and shows signs of getting worse (redness, swelling, pain, or pus).  MAKE SURE YOU:   Understand these instructions.   Will watch your condition.   Will get help right away if you are not doing well or get worse.  Document Released: 08/14/2005 Document Re-Released: 07/27/2008 [facility] Patient Information 2011 [place], [place].

## 2011-02-14 ENCOUNTER — Telehealth: Payer: Self-pay | Admitting: Family Medicine

## 2011-02-14 ENCOUNTER — Other Ambulatory Visit: Payer: Self-pay | Admitting: Family Medicine

## 2011-02-14 MED ORDER — OFLOXACIN 0.3 % OT SOLN: 10.0000 [drp] | Freq: Every day | OTIC | Status: AC

## 2011-02-14 MED ORDER — HYDROCODONE-ACETAMINOPHEN 5-325 MG PO TABS: 1.0000 | ORAL_TABLET | Freq: Four times a day (QID) | ORAL | Status: AC | PRN

## 2011-02-14 NOTE — Progress Notes (Signed)
Spoke with mom.  Naprosyn not cutting pain.  Recommend increase analgesic to vicodin.  Pt with codeine allergy but has done ok with other narcotics in past.  Called it into [facility].  Also changed cipro HC to oflox otic as cipro HC not covered by medicaid.

## 2011-02-16 ENCOUNTER — Ambulatory Visit (INDEPENDENT_AMBULATORY_CARE_PROVIDER_SITE_OTHER): Payer: Medicaid Other | Admitting: Family Medicine

## 2011-02-16 ENCOUNTER — Encounter: Payer: Self-pay | Admitting: Family Medicine

## 2011-02-16 VITALS — BP 118/78 | HR 80 | Temp 97.8°F | Wt 284.0 lb

## 2011-02-16 DIAGNOSIS — H6093 Unspecified otitis externa, bilateral: Secondary | ICD-10-CM

## 2011-02-16 DIAGNOSIS — H60399 Other infective otitis externa, unspecified ear: Secondary | ICD-10-CM

## 2011-02-16 MED ORDER — HYDROCORTISONE-ACETIC ACID 1-2 % OT SOLN: 4.0000 [drp] | Freq: Four times a day (QID) | OTIC | Status: AC | PRN

## 2011-02-16 MED ORDER — NAPROXEN 500 MG PO TBEC: 500.0000 mg | DELAYED_RELEASE_TABLET | Freq: Two times a day (BID) | ORAL | Status: DC | PRN

## 2011-02-16 NOTE — Assessment & Plan Note (Addendum)
Deteriorated on R but still consistent with otitis eterna.   Continue oflox, add vosol HC for inflammation, add scheduled naprosyn for next 3-5 days with food.  Stop vicodin, presumed caused auditory hallucinations. Update if red flags or worsening.  Consider ENT referral if not better.

## 2011-02-16 NOTE — Progress Notes (Signed)
  Subjective:    Patient ID: Christine Koch, female    DOB: 1976-08-21, 35 y.o.   MRN: [id number]  HPI CC: recheck ear  Seen here 2 days ago, dx with bilat otitis externa R>L, started on cipro HC.  Trouble getting from medicaid so changed to oflox otic.  Pain treated with vicodin.  Oflox has had 3 doses (daily dosing).  No fevers.    Worse pain.  Up last night x 3.  vicodin at 12:30am, woke up again at 2:30am with auditory hallucinations.  Gave advil.  Feeling swelling under and in right ear.  Painful to open/close mouth.  Appetite down 2/2 pain.  Left ear doing ok.  Review of Systems Per HPI    Objective:   Physical Exam  Nursing note and vitals reviewed. Constitutional: She appears well-developed and well-nourished.       Uncomfortable  HENT:  Head: Normocephalic and atraumatic.  Right Ear: Hearing and external ear normal.  Left Ear: Hearing and external ear normal.  Mouth/Throat: Oropharynx is clear and moist. No oropharyngeal exudate.       Unable to visualize TMs bilaterally. R ext canal significantly swollen, macerated. No mastoid tenderness or pain with movement of pinna. + tragus tender on right.  Eyes: Conjunctivae and EOM are normal. Pupils are equal, round, and reactive to light. No scleral icterus.  Neck: Normal range of motion. Neck supple.  Lymphadenopathy:       Head (right side): Posterior auricular adenopathy present. No submental, no submandibular, no tonsillar, no preauricular and no occipital adenopathy present.       Head (left side): No submental, no submandibular, no tonsillar, no preauricular, no posterior auricular and no occipital adenopathy present.    She has no cervical adenopathy.  Skin: Skin is warm and dry. No rash noted.          Assessment & Plan:

## 2011-02-16 NOTE — Patient Instructions (Addendum)
Add vosol HC ear drops to antibiotics (antibiotics once daily first then other drops drops every 4-6 hours as needed for next 7 days). Naprosyn twice daily for inflammation with food for next 3-5 days then as needed.  No advil or ibuprofen with this. Update [place] if worsening swelling, pain or fever, or worsening trouble eating. Stop vicodin

## 2011-02-17 NOTE — Telephone Encounter (Signed)
Error no phone note

## 2011-05-08 ENCOUNTER — Encounter: Payer: Self-pay | Admitting: Family Medicine

## 2011-05-08 ENCOUNTER — Ambulatory Visit (INDEPENDENT_AMBULATORY_CARE_PROVIDER_SITE_OTHER): Payer: Medicaid Other | Admitting: Family Medicine

## 2011-05-08 DIAGNOSIS — N390 Urinary tract infection, site not specified: Secondary | ICD-10-CM | POA: Insufficient documentation

## 2011-05-08 DIAGNOSIS — B009 Herpesviral infection, unspecified: Secondary | ICD-10-CM

## 2011-05-08 DIAGNOSIS — B001 Herpesviral vesicular dermatitis: Secondary | ICD-10-CM | POA: Insufficient documentation

## 2011-05-08 MED ORDER — SULFAMETHOXAZOLE-TRIMETHOPRIM 800-160 MG PO TABS: 1.0000 | ORAL_TABLET | Freq: Two times a day (BID) | ORAL | Status: AC

## 2011-05-08 MED ORDER — VALACYCLOVIR HCL 1 G PO TABS: 2000.0000 mg | ORAL_TABLET | Freq: Two times a day (BID) | ORAL | Status: AC

## 2011-05-08 NOTE — Assessment & Plan Note (Signed)
Valtrex and f/u prn.  1 day tx.

## 2011-05-08 NOTE — Assessment & Plan Note (Signed)
Unable to give urine for ua/ucx.  Start septra and f/u prn.  Fluids in meantime also.

## 2011-05-08 NOTE — Patient Instructions (Signed)
Take the valtrex for 1 day for the fever blister.  Take septra for 3 days for the UTI.  Drink plenty of water and let us know if you have other concerns.  Take care.  Glad to see you.

## 2011-05-08 NOTE — Progress Notes (Signed)
H/o short term memory loss but her report there was a history of dysuria, starting last night.  "I think I have a UTI."  Burning with urination.  No external burning per patient.  No fevers, no vomiting.  No diarrhea, no cough.  No abd pain.  Unable to give urine sample on the way in today.  Denies vaginal discharge.  She is able to recall her med list here today.    New fever blister on upper, right side of lip.    Meds, vitals, and allergies reviewed.   ROS: See HPI.  Otherwise, noncontributory.  Nad, alert and pleasant Mmm with small lip lesion noted as above Neck supple rrr ctab abd soft, not ttp No CVA pain

## 2011-05-12 ENCOUNTER — Ambulatory Visit (INDEPENDENT_AMBULATORY_CARE_PROVIDER_SITE_OTHER): Payer: Medicaid Other | Admitting: Family Medicine

## 2011-05-12 ENCOUNTER — Encounter: Payer: Self-pay | Admitting: Family Medicine

## 2011-05-12 DIAGNOSIS — M79609 Pain in unspecified limb: Secondary | ICD-10-CM

## 2011-05-12 DIAGNOSIS — M79601 Pain in right arm: Secondary | ICD-10-CM

## 2011-05-12 NOTE — Progress Notes (Signed)
  Subjective:    Patient ID: Christine Koch, female    DOB: 11-24-1975, 35 y.o.   MRN: [id number]  HPI CC: R arm pain  H/o TBI.  Started last night.  Pt felt like she had sunburn on arm, then felt tight.  Took 4 baby aspirin last night.  Felt tired last night.  This morning again felt like sunburn.  To go to camp tonight.  Mom wanted her checked out prior to trip.  Did fall down 2 stairs on Sunday, hit back of right shoulder.  Went to chiropractor last Monday and wasn't complaining of pain.  Does work at [employer], wonders if may have lifted something heavy.  No fevers/chills, no SOB.  Had UTI last week, finished sulfa.  On crestor for 1-2 yrs.  Prior was on lipitor.  Tends to take naproxyn for headaches.  Review of Systems Per HPI    Objective:   Physical Exam  Nursing note and vitals reviewed. Constitutional: She appears well-developed and well-nourished. No distress.  HENT:  Head: Normocephalic and atraumatic.  Mouth/Throat: Oropharynx is clear and moist. No oropharyngeal exudate.  Musculoskeletal: Normal range of motion. She exhibits no edema.       Right shoulder: Normal. She exhibits normal range of motion, no tenderness, no bony tenderness and no swelling.       Left shoulder: Normal.  Neurological: She has normal strength. No sensory deficit.  Reflex Scores:      Bicep reflexes are 2+ on the right side and 2+ on the left side.      Grip strength intact.  Skin: Skin is warm and dry. No rash noted.  Psychiatric: She has a normal mood and affect.   Bilateral arm circumference 14cm.    Assessment & Plan:

## 2011-05-12 NOTE — Patient Instructions (Signed)
I expect this is residual from recent fall. Arms looking ok now.  Let us know if not improving with naproxyn.

## 2011-05-12 NOTE — Assessment & Plan Note (Signed)
No erythema or swelling on exam, exam benign.  No skin changes. ?residual from recent fall down stairs.  Treat with naprosyn for next few days, update if not improving as expected.

## 2011-06-01 ENCOUNTER — Ambulatory Visit (INDEPENDENT_AMBULATORY_CARE_PROVIDER_SITE_OTHER): Payer: Medicaid Other

## 2011-06-01 DIAGNOSIS — Z23 Encounter for immunization: Secondary | ICD-10-CM

## 2011-06-08 ENCOUNTER — Other Ambulatory Visit: Payer: Self-pay | Admitting: *Deleted

## 2011-06-08 MED ORDER — VALACYCLOVIR HCL 500 MG PO TABS: 500.0000 mg | ORAL_TABLET | Freq: Every day | ORAL | Status: AC

## 2011-06-08 NOTE — Telephone Encounter (Signed)
SHE IS USING THESE FOR COLD SORES

## 2011-06-16 ENCOUNTER — Ambulatory Visit (INDEPENDENT_AMBULATORY_CARE_PROVIDER_SITE_OTHER): Payer: Medicaid Other | Admitting: Family Medicine

## 2011-06-16 ENCOUNTER — Encounter: Payer: Self-pay | Admitting: Family Medicine

## 2011-06-16 VITALS — BP 116/78 | HR 68 | Temp 98.2°F | Wt 288.2 lb

## 2011-06-16 DIAGNOSIS — N949 Unspecified condition associated with female genital organs and menstrual cycle: Secondary | ICD-10-CM

## 2011-06-16 DIAGNOSIS — R102 Pelvic and perineal pain: Secondary | ICD-10-CM

## 2011-06-16 NOTE — Patient Instructions (Signed)
I don't see evidence of infection today. Outer private area did have red rash I wonder if this is due to irritation from soap residue. I'd recommend washing with water.  If not improving, let me know for short steroid cream. Call us if not improving as expected, or any change in symptoms.

## 2011-06-16 NOTE — Progress Notes (Signed)
  Subjective:    Patient ID: Christine Koch, female    DOB: April 27, 1976, 35 y.o.   MRN: [id number]  HPI CC: vaginal pain  Describes 1d h/o constant pain along with sharp component.  + itching.  Unsure if inside or outside.  Has only voided twice today, no dysuria or urgency.  No abd pain, n/v, fevers.  Denies vag discharge.  LMP unsure, thinks last a few days ago.  Verified with mom, LMP 1 wk ago.  Uses tampons or pads.  Spoke with mom - concern for possible tampon remaining.  Also did wash vaginal area last week, may have residual soap.  Review of Systems Per HPI    Objective:   Physical Exam  Nursing note and vitals reviewed. Constitutional: She appears well-developed and well-nourished. No distress.  Abdominal: Soft. Bowel sounds are normal. She exhibits no distension. There is no tenderness. There is no rebound, no guarding and no CVA tenderness.  Genitourinary: Uterus normal.    Pelvic exam was performed with patient supine. No labial fusion. There is rash on the right labia. There is no tenderness, lesion or injury on the right labia. There is rash on the left labia. There is no tenderness, lesion or injury on the left labia. Cervix exhibits friability (mild). Cervix exhibits no motion tenderness and no discharge. Right adnexum displays no mass, no tenderness and no fullness. Left adnexum displays no mass, no tenderness and no fullness. There is bleeding around the vagina. No vaginal discharge found.       Erythematous rash bilateral external labia majora. No vaginal discharge.  No foreign body in vaginal vault.  Skin: Skin is warm and dry. No rash noted.      Assessment & Plan:

## 2011-06-17 DIAGNOSIS — R102 Pelvic and perineal pain: Secondary | ICD-10-CM | POA: Insufficient documentation

## 2011-06-17 LAB — POCT WET PREP (WET MOUNT): Yeast Wet Prep HPF POC: 0

## 2011-06-17 NOTE — Assessment & Plan Note (Signed)
No significant discharge on exam, wet prep negative for infection.  There was mild amt blood, in setting of recent period.   No tampon found. External genitalia with irritant dermatitis, could be from residual soap.  Recommended wash area well with water.  If continues to burn/itch, could do short course of mild topical steroid (desonide daily for 1 wk). Mild cervical friability on exam.  Last pap >1 yr ago.  Mom states will schedule appt with GYN for rpt well woman exam.

## 2011-08-10 ENCOUNTER — Ambulatory Visit (INDEPENDENT_AMBULATORY_CARE_PROVIDER_SITE_OTHER): Payer: Medicaid Other | Admitting: Family Medicine

## 2011-08-10 ENCOUNTER — Other Ambulatory Visit (HOSPITAL_COMMUNITY)
Admission: RE | Admit: 2011-08-10 | Discharge: 2011-08-10 | Disposition: A | Discharge status: Home / Self Care | Payer: Medicaid Other | Source: Ambulatory Visit | Attending: Family Medicine | Admitting: Family Medicine

## 2011-08-10 ENCOUNTER — Encounter: Payer: Self-pay | Admitting: Family Medicine

## 2011-08-10 VITALS — BP 110/70 | HR 77 | Temp 97.9°F | Ht 70.0 in | Wt 292.0 lb

## 2011-08-10 DIAGNOSIS — Z01419 Encounter for gynecological examination (general) (routine) without abnormal findings: Secondary | ICD-10-CM

## 2011-08-10 DIAGNOSIS — S069XAA Unspecified intracranial injury with loss of consciousness status unknown, initial encounter: Secondary | ICD-10-CM | POA: Insufficient documentation

## 2011-08-10 DIAGNOSIS — S069X9A Unspecified intracranial injury with loss of consciousness of unspecified duration, initial encounter: Secondary | ICD-10-CM

## 2011-08-10 DIAGNOSIS — D353 Benign neoplasm of craniopharyngeal duct: Secondary | ICD-10-CM

## 2011-08-10 DIAGNOSIS — D352 Benign neoplasm of pituitary gland: Secondary | ICD-10-CM

## 2011-08-10 DIAGNOSIS — Z0000 Encounter for general adult medical examination without abnormal findings: Secondary | ICD-10-CM

## 2011-08-10 DIAGNOSIS — B373 Candidiasis of vulva and vagina: Secondary | ICD-10-CM

## 2011-08-10 DIAGNOSIS — Z1159 Encounter for screening for other viral diseases: Secondary | ICD-10-CM | POA: Insufficient documentation

## 2011-08-10 DIAGNOSIS — G43909 Migraine, unspecified, not intractable, without status migrainosus: Secondary | ICD-10-CM | POA: Insufficient documentation

## 2011-08-10 MED ORDER — FLUCONAZOLE 150 MG PO TABS: 150.0000 mg | ORAL_TABLET | Freq: Once | ORAL | Status: AC

## 2011-08-10 MED ORDER — RIZATRIPTAN BENZOATE 10 MG PO TABS: 10.0000 mg | ORAL_TABLET | ORAL | Status: DC | PRN

## 2011-08-10 NOTE — Assessment & Plan Note (Signed)
Treat with diflucan x 1.  

## 2011-08-10 NOTE — Patient Instructions (Addendum)
Let us know when last Tdap was.  Work on exercise, healthy eating and weight loss.  Headaches: typical of migraines: will refill maxalt.  Vaginal soreness, no lesions seen.. Just discharge and irritation.. Yeast on wet prep.. Will treat with diflucan x 1 . Let me know if not improving.  Recommend labs such as lipids, DM screen (CMET) fasting given obesity... Let me know if interested.  Follow up in 1 year for CPX.

## 2011-08-10 NOTE — Assessment & Plan Note (Signed)
Refilled maxalt

## 2011-08-10 NOTE — Progress Notes (Signed)
Subjective:     Patient ID: Christine Koch, female   DOB: [birth year], 35 y.o.   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 Also has history of pituitary tumor and hydrocephaly age 87.  She is followed chronically by  Dr. Evlyn Kanner ENDO.  The patient is here for annual wellness exam and preventative care.    If she has been fatigued, sudden onset of  headache, frontal, occ nausea. Treated in past for migraines...with maxalt. On topamax  And verapamil for migraine preventative.  Goes away if goes to bed with naproxen or advil. No neuro symptoms.  She does not currently have  [facility]...last few months stable MRI.  Vaginal soreness, inside x 2 week. No lesions per pt. No discharge, not itchy. Just generally irritated and dry. No sexual activity. Menses regular.  No new recent exposures.   Review of Systems  Constitutional: Negative for fever, fatigue and unexpected weight change.  HENT: Negative for ear pain, congestion, sore throat, sneezing, trouble swallowing and sinus pressure.   Eyes: Negative for pain and itching.  Respiratory: Negative for cough, shortness of breath and wheezing.   Cardiovascular: Negative for chest pain, palpitations and leg swelling.  Gastrointestinal: Negative for nausea, abdominal pain, diarrhea, constipation and blood in stool.  Genitourinary: Positive for vaginal pain. Negative for dysuria, hematuria, vaginal discharge, difficulty urinating and menstrual problem.  Skin: Negative for rash.  Neurological: Negative for syncope, weakness, light-headedness, numbness and headaches.  Psychiatric/Behavioral: Negative for confusion and dysphoric mood. The patient is not nervous/anxious.        Objective:   Physical Exam  Constitutional: Vital signs are normal. She appears well-developed and well-nourished. She is cooperative.  Non-toxic appearance. She does not appear ill. No distress.       obese    HENT:  Head: Normocephalic.  Right Ear: Hearing, tympanic membrane, external ear and ear canal normal.  Left Ear: Hearing, tympanic membrane, external ear and ear canal normal.  Nose: Nose normal.  Eyes: Conjunctivae, EOM and lids are normal. Pupils are equal, round, and reactive to light. No foreign bodies found.  Neck: Trachea normal and normal range of motion. Neck supple. Carotid bruit is not present. No mass and no thyromegaly present.  Cardiovascular: Normal rate, regular rhythm, S1 normal, S2 normal, normal heart sounds and intact distal pulses.  Exam reveals no gallop.   No murmur heard. Pulmonary/Chest: Effort normal and breath sounds normal. No respiratory distress. She has no wheezes. She has no rhonchi. She has no rales.  Abdominal: Soft. Normal appearance and bowel sounds are normal. She exhibits no distension, no fluid wave, no abdominal bruit and no mass. There is no hepatosplenomegaly. There is no tenderness. There is no rebound, no guarding and no CVA tenderness. No hernia.  Genitourinary: Uterus normal. No breast swelling, tenderness, discharge or bleeding. Pelvic exam was performed with patient prone. There is no rash, tenderness or lesion on the right labia. There is no rash, tenderness or lesion on the left labia. Uterus is not enlarged and not tender. Cervix exhibits no motion tenderness, no discharge and no friability. Right adnexum displays no mass, no tenderness and no fullness. Left adnexum displays no mass, no tenderness and no fullness. There is erythema and tenderness around the vagina. Vaginal discharge found.       No vaginal lesions, just diffuse irritation  Lymphadenopathy:    She has no cervical adenopathy.    She has  no axillary adenopathy.  Neurological: She is alert. She has normal strength. No cranial nerve deficit or sensory deficit.  Skin: Skin is warm, dry and intact. No rash noted.  Psychiatric: Her speech is normal and behavior is normal. Judgment  normal. Her mood appears not anxious. Cognition and memory are normal. She does not exhibit a depressed mood.       Assessment:     CPX:The patient's preventative maintenance and recommended screening tests for an annual wellness exam were reviewed in full today. Brought up to date unless services declined.  Counselled on the importance of diet, exercise, and its role in overall health and mortality. The patient's FH and SH was reviewed, including their home life, tobacco status, and drug and alcohol status.       Plan:     Vaccines: flu uptodate, mother not sure if Tdap uptodate.. Will look into. PAP, DVE: if this is normal repeat every 3 years. Recommend healthy eating, exercise and weight loss...given obesity recommend DM and chol screen.

## 2011-09-21 ENCOUNTER — Telehealth: Payer: Self-pay | Admitting: *Deleted

## 2011-09-21 NOTE — Telephone Encounter (Signed)
Patient left an ear ring in to long and it got stuck to ear and mother had to cut it out. Ear is swollen and read but, patient mother wants to know if antibiotic can be called in to [facility] [place]

## 2011-09-21 NOTE — Telephone Encounter (Signed)
i think ok  Keflex 500 mg po 4 times a day for a week #28

## 2011-09-22 MED ORDER — CEPHALEXIN 500 MG PO CAPS: 500.0000 mg | ORAL_CAPSULE | Freq: Four times a day (QID) | ORAL | Status: AC

## 2012-10-30 ENCOUNTER — Encounter: Payer: Self-pay | Admitting: Gynecology

## 2012-10-30 ENCOUNTER — Other Ambulatory Visit (HOSPITAL_COMMUNITY)
Admission: RE | Admit: 2012-10-30 | Discharge: 2012-10-30 | Disposition: A | Discharge status: Home / Self Care | Payer: Medicaid Other | Source: Ambulatory Visit | Attending: Gynecology | Admitting: Gynecology

## 2012-10-30 ENCOUNTER — Ambulatory Visit (INDEPENDENT_AMBULATORY_CARE_PROVIDER_SITE_OTHER): Payer: Medicaid Other | Admitting: Gynecology

## 2012-10-30 VITALS — BP 132/88 | Wt 267.0 lb

## 2012-10-30 DIAGNOSIS — R32 Unspecified urinary incontinence: Secondary | ICD-10-CM

## 2012-10-30 DIAGNOSIS — Z01419 Encounter for gynecological examination (general) (routine) without abnormal findings: Secondary | ICD-10-CM

## 2012-10-30 DIAGNOSIS — R351 Nocturia: Secondary | ICD-10-CM

## 2012-10-30 DIAGNOSIS — N925 Other specified irregular menstruation: Secondary | ICD-10-CM

## 2012-10-30 DIAGNOSIS — N3941 Urge incontinence: Secondary | ICD-10-CM

## 2012-10-30 MED ORDER — METRONIDAZOLE 0.75 % VA GEL: 1.0000 | Freq: Two times a day (BID) | VAGINAL | Status: DC

## 2012-10-30 NOTE — Progress Notes (Signed)
Christine Koch 04/22/1976 [id number]   History:    37 y.o.  for annual gyn exam who has not been seensince 2010. 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 Her primary physician is Dr. Adrian Prince who has been treating her as well for hypothyroidism as well as hyper prolactinemia and hypercholesterolemia. Patient had been complaining of constant bleeding since early January and stopped approximately 3-4 days ago. She is not sexually active and is on no hormones. She was complaining of urgency incontinence and nocturia where she would get up 3-4 times at night to urinate and sometimes  Would have enuresis.Patient with no past history of abnormal Pap smears.  Past medical history,surgical history, family history and social history were all reviewed and documented in the EPIC chart.  Gynecologic History Patient's last menstrual period was 10/21/2012. Contraception: none Last Pap: 2010. Results were: normal Last mammogram: not indicated. Results were: not indicated  Obstetric History OB History   Grav Para Term Preterm Abortions TAB SAB Ect Mult Living                   ROS: A ROS was performed and pertinent positives and negatives are included in the history.  GENERAL: No fevers or chills. HEENT: No change in vision, no earache, sore throat or sinus congestion. NECK: No pain or stiffness. CARDIOVASCULAR: No chest pain or pressure. No palpitations. PULMONARY: No shortness of breath, cough or wheeze. GASTROINTESTINAL: No abdominal pain, nausea, vomiting or diarrhea, melena or bright red blood per rectum. GENITOURINARY: No urinary frequency, urgency, hesitancy or dysuria. MUSCULOSKELETAL: No joint or muscle pain, no back pain, no recent trauma. DERMATOLOGIC: No rash, no itching, no lesions. ENDOCRINE: No polyuria, polydipsia, no heat or cold intolerance. No  recent change in weight. HEMATOLOGICAL: No anemia or easy bruising or bleeding. NEUROLOGIC: No headache, seizures, numbness, tingling or weakness. PSYCHIATRIC: No depression, no loss of interest in normal activity or change in sleep pattern.     Exam: chaperone present  BP 132/88  Wt 267 lb (121.11 kg)  BMI 38.31 kg/m2  LMP 10/21/2012  Body mass index is 38.31 kg/(m^2).  General appearance : Well developed well nourished female. No acute distress HEENT: Neck supple, trachea midline, no carotid bruits, no thyroidmegaly Lungs: Clear to auscultation, no rhonchi or wheezes, or rib retractions  Heart: Regular rate and rhythm, no murmurs or gallops Breast:Examined in sitting and supine position were symmetrical in appearance, no palpable masses or tenderness,  no skin retraction, no nipple inversion, no nipple discharge, no skin discoloration, no axillary or supraclavicular lymphadenopathy Abdomen: no palpable masses or tenderness, no rebound or guarding Extremities: no edema or skin discoloration or tenderness  Pelvic:  Bartholin, Urethra, Skene Glands: Within normal limits             Vagina: No gross lesions or discharge  Cervix: No gross lesions or discharge, friable  Uterus  Unable to do exam due to patient's anxiety and vaginismus  Adnexa same as above  Anus and perineum  normal   Rectovaginal  normal sphincter tone without palpated masses or tenderness             Hemoccult not indicated   After the Pap smear was obtained the cervix was cleansed with Betadine solution and a sterile Pipelle was introduced into the intrauterine cavity. An endometrial biopsy was obtained  and tissue submitted for histological evaluation. Due to patient's anxiety apprehension and vaginismus a full pelvic exam was not possible and she will return back next week for sonohysterogram as well  Assessment/Plan:  37 y.o. female for annual exam with dysfunctional uterine bleeding will be scheduled for  sonohysterogram next week. Endometrial biopsy done today resulting in time of this dictation. For patient's overactive bladder she will be placed on Oxytrol patch that she can apply 2 times a week and purchase it over-the-counter. She will check with her primary physician effort Tdap is up-to-date. Dr. Evlyn Kanner has been doing her lab work was also currently treating her for vitamin D deficiency. If she continues to have urgency incontinence and nocturia after being on the Oxytrol patch we may need to refer her to the urologist for urodynamic evaluation.    Ok Edwards MD, 11:33 AM 10/30/2012

## 2012-10-30 NOTE — Patient Instructions (Addendum)

## 2012-11-19 ENCOUNTER — Telehealth: Payer: Self-pay | Admitting: *Deleted

## 2012-11-19 NOTE — Telephone Encounter (Signed)
Pt said cycle is just now stopping, scheduled for [facility] tomorrow and asked if okay to still come. No heavy bleeding now, very very light spotting. Pt informed to keep appointment.

## 2012-11-20 ENCOUNTER — Ambulatory Visit: Payer: Medicaid Other | Admitting: Gynecology

## 2012-11-20 ENCOUNTER — Other Ambulatory Visit: Payer: Self-pay

## 2012-11-20 ENCOUNTER — Other Ambulatory Visit: Payer: Self-pay | Admitting: Gynecology

## 2012-11-20 ENCOUNTER — Other Ambulatory Visit: Payer: Medicaid Other

## 2012-11-20 ENCOUNTER — Ambulatory Visit: Payer: Self-pay | Admitting: Gynecology

## 2012-11-20 ENCOUNTER — Ambulatory Visit (INDEPENDENT_AMBULATORY_CARE_PROVIDER_SITE_OTHER): Payer: Medicaid Other

## 2012-11-20 DIAGNOSIS — N8300 Follicular cyst of ovary, unspecified side: Secondary | ICD-10-CM

## 2012-11-20 DIAGNOSIS — N938 Other specified abnormal uterine and vaginal bleeding: Secondary | ICD-10-CM

## 2012-11-20 DIAGNOSIS — N831 Corpus luteum cyst of ovary, unspecified side: Secondary | ICD-10-CM

## 2012-11-20 DIAGNOSIS — N921 Excessive and frequent menstruation with irregular cycle: Secondary | ICD-10-CM

## 2012-11-20 DIAGNOSIS — R188 Other ascites: Secondary | ICD-10-CM

## 2012-11-20 DIAGNOSIS — N949 Unspecified condition associated with female genital organs and menstrual cycle: Secondary | ICD-10-CM

## 2012-11-20 DIAGNOSIS — N946 Dysmenorrhea, unspecified: Secondary | ICD-10-CM

## 2012-11-20 NOTE — Progress Notes (Signed)
Patient presented to the office today for sonohysterogram as part of evaluation her recent breakthrough bleeding. See previous note dated March 5 additional details. At that office visit patient had an endometrial biopsy with the following result:  Endometrium, biopsy - PROLIFERATIVE-TYPE ENDOMETRIUM WITH BREAKDOWN. - BENIGN ENDOCERVICAL TYPE MUCOSA. - THERE IS NO EVIDENCE OF HYPERPLASIA OR MALIGNANCY.  Ultrasound today: Uterus measured 8.3 x 5.5 x 3.8 cm with endometrial stripe of 2.4 mm. Right ovary was normal left ovary small follicle was noted sonohysterogram after instilling normal saline did not demonstrate any intracavitary lesions.  Patient on last visit was started on Oxytrol patch for overactive bladder and states that it has worked really well and she's excited because she is much dryer now and goes less frequent to urinate.  Assessment/plan: Isolated episodes of breakthrough bleeding with negative workup. Will monitor with a menstrual calendar for over the course of the next 6 months. Otherwise we will see her back next year for annual exam or when necessary. We did discuss of this becomes a recurrent problem that she may be a good candidate for a Mirena IUD. Patient is not sexually active.

## 2013-07-01 ENCOUNTER — Telehealth: Payer: Self-pay | Admitting: Family Medicine

## 2013-07-02 NOTE — Telephone Encounter (Signed)
Error

## 2013-11-05 ENCOUNTER — Ambulatory Visit (INDEPENDENT_AMBULATORY_CARE_PROVIDER_SITE_OTHER): Payer: Medicaid Other | Admitting: Gynecology

## 2013-11-05 ENCOUNTER — Encounter: Payer: Self-pay | Admitting: Gynecology

## 2013-11-05 VITALS — BP 130/76 | Ht 68.0 in | Wt 268.0 lb

## 2013-11-05 DIAGNOSIS — Z01419 Encounter for gynecological examination (general) (routine) without abnormal findings: Secondary | ICD-10-CM

## 2013-11-05 NOTE — Progress Notes (Signed)
Christine Koch 12-22-1975 [id number]   History:    38 y.o.  for annual gyn exam with no complaints today. Patient was brought in by her mother. 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 Patient also has a history of pituitary tumor hydrocephaly at the age of 64.Her primary physician is Dr. Reynold Bowen who has been treating her as well for hypothyroidism as well as hyper prolactinemia and hypercholesterolemia, and vitamin D deficiency. Patient has suffered from overactive bladder and is now on oral anticholinergic. See medication list as well as past medical history for additional details. Patient with no prior history of abnormal Pap smears. Patient reporting normal like menstrual periods. Patient not sexually active.  Past medical history,surgical history, family history and social history were all reviewed and documented in the EPIC chart.  Gynecologic History Patient's last menstrual period was 10/22/2013. Contraception: none Last Pap: 2014. Results were: normal Last mammogram: Not indicated. Results were: Not indicated  Obstetric History OB History  No data available     ROS: A ROS was performed and pertinent positives and negatives are included in the history.  GENERAL: No fevers or chills. HEENT: No change in vision, no earache, sore throat or sinus congestion. NECK: No pain or stiffness. CARDIOVASCULAR: No chest pain or pressure. No palpitations. PULMONARY: No shortness of breath, cough or wheeze. GASTROINTESTINAL: No abdominal pain, nausea, vomiting or diarrhea, melena or bright red blood per rectum. GENITOURINARY: No urinary frequency, urgency, hesitancy or dysuria. MUSCULOSKELETAL: No joint or muscle pain, no back pain, no recent trauma. DERMATOLOGIC: No rash, no itching, no lesions. ENDOCRINE: No polyuria, polydipsia, no heat or cold intolerance. No recent change in weight. HEMATOLOGICAL: No anemia or easy bruising or bleeding. NEUROLOGIC: No  headache, seizures, numbness, tingling or weakness. PSYCHIATRIC: No depression, no loss of interest in normal activity or change in sleep pattern.     Exam: chaperone present  BP 130/76  Ht 5\' 8"  (1.727 m)  Wt 268 lb (121.564 kg)  BMI 40.76 kg/m2  LMP 10/22/2013  Body mass index is 40.76 kg/(m^2).  General appearance : Well developed well nourished female. No acute distress HEENT: Neck supple, trachea midline, no carotid bruits, no thyroidmegaly Lungs: Clear to auscultation, no rhonchi or wheezes, or rib retractions  Heart: Regular rate and rhythm, no murmurs or gallops Breast:Examined in sitting and supine position were symmetrical in appearance, no palpable masses or tenderness,  no skin retraction, no nipple inversion, no nipple discharge, no skin discoloration, no axillary or supraclavicular lymphadenopathy Abdomen: no palpable masses or tenderness, no rebound or guarding Extremities: no edema or skin discoloration or tenderness  Pelvic:  Bartholin, Urethra, Skene Glands: Within normal limits             Vagina: No gross lesions or discharge  Cervix: No gross lesions or discharge  Uterus  anteverted, normal size, shape and consistency, non-tender and mobile  Adnexa  Without masses or tenderness  Anus and perineum  normal   Rectovaginal  normal sphincter tone without palpated masses or tenderness             Hemoccult not indicated     Assessment/Plan:  38 y.o. female for annual exam with no problems reported today. Normal gynecological exam. Pap smear not done today in accordance to the The new guidelines. Patient's immunizations are up-to-date. Her PCP has been doing her blood work.  Note: This dictation was prepared with  Dragon/digital dictation along withSmart phrase technology. Any transcriptional  errors that result from this process are unintentional.   Terrance Mass MD, 9:01 AM 11/05/2013

## 2014-12-16 ENCOUNTER — Encounter (HOSPITAL_COMMUNITY): Payer: Self-pay | Admitting: Emergency Medicine

## 2014-12-16 ENCOUNTER — Emergency Department (INDEPENDENT_AMBULATORY_CARE_PROVIDER_SITE_OTHER)
Admission: EM | Admit: 2014-12-16 | Discharge: 2014-12-16 | Disposition: A | Discharge status: Home / Self Care | Payer: Medicaid Other | Source: Home / Self Care | Attending: Family Medicine | Admitting: Family Medicine

## 2014-12-16 ENCOUNTER — Emergency Department (INDEPENDENT_AMBULATORY_CARE_PROVIDER_SITE_OTHER): Payer: Medicaid Other

## 2014-12-16 DIAGNOSIS — S91114A Laceration without foreign body of right lesser toe(s) without damage to nail, initial encounter: Secondary | ICD-10-CM | POA: Diagnosis not present

## 2014-12-16 MED ORDER — TETANUS-DIPHTH-ACELL PERTUSSIS 5-2.5-18.5 LF-MCG/0.5 IM SUSP
INTRAMUSCULAR | Status: AC
Start: 2014-12-16 — End: 2014-12-16
  Filled 2014-12-16: qty 0.5

## 2014-12-16 MED ORDER — CEFUROXIME AXETIL 250 MG PO TABS: 250.0000 mg | ORAL_TABLET | Freq: Two times a day (BID) | ORAL | Status: DC

## 2014-12-16 MED ORDER — CHLORHEXIDINE GLUCONATE 4 % EX LIQD
Freq: Once | CUTANEOUS | Status: AC
  Administered 2014-12-16: 12:00:00 via TOPICAL

## 2014-12-16 MED ORDER — TETANUS-DIPHTH-ACELL PERTUSSIS 5-2.5-18.5 LF-MCG/0.5 IM SUSP
0.5000 mL | Freq: Once | INTRAMUSCULAR | Status: AC
  Administered 2014-12-16: 0.5 mL via INTRAMUSCULAR

## 2014-12-16 NOTE — Discharge Instructions (Signed)
Wear the rigid sole shoe for one week Keep the area of the laceration clean, dry, and covered with a Band-Aid Warm soaks 1-2 times daily to help prevent infection Limit walking for a few days Take all the antibiotics Come back if this starts to get worse or look infected   Laceration Care, Adult A laceration is a cut or lesion that goes through all layers of the skin and into the tissue just beneath the skin. TREATMENT  Some lacerations may not require closure. Some lacerations may not be able to be closed due to an increased risk of infection. It is important to see your caregiver as soon as possible after an injury to minimize the risk of infection and maximize the opportunity for successful closure. If closure is appropriate, pain medicines may be given, if needed. The wound will be cleaned to help prevent infection. Your caregiver will use stitches (sutures), staples, wound glue (adhesive), or skin adhesive strips to repair the laceration. These tools bring the skin edges together to allow for faster healing and a better cosmetic outcome. However, all wounds will heal with a scar. Once the wound has healed, scarring can be minimized by covering the wound with sunscreen during the day for 1 full year. HOME CARE INSTRUCTIONS  For sutures or staples:  Keep the wound clean and dry.  If you were given a bandage (dressing), you should change it at least once a day. Also, change the dressing if it becomes wet or dirty, or as directed by your caregiver.  Wash the wound with soap and water 2 times a day. Rinse the wound off with water to remove all soap. Pat the wound dry with a clean towel.  After cleaning, apply a thin layer of the antibiotic ointment as recommended by your caregiver. This will help prevent infection and keep the dressing from sticking.  You may shower as usual after the first 24 hours. Do not soak the wound in water until the sutures are removed.  Only take over-the-counter  or prescription medicines for pain, discomfort, or fever as directed by your caregiver.  Get your sutures or staples removed as directed by your caregiver. For skin adhesive strips:  Keep the wound clean and dry.  Do not get the skin adhesive strips wet. You may bathe carefully, using caution to keep the wound dry.  If the wound gets wet, pat it dry with a clean towel.  Skin adhesive strips will fall off on their own. You may trim the strips as the wound heals. Do not remove skin adhesive strips that are still stuck to the wound. They will fall off in time. For wound adhesive:  You may briefly wet your wound in the shower or bath. Do not soak or scrub the wound. Do not swim. Avoid periods of heavy perspiration until the skin adhesive has fallen off on its own. After showering or bathing, gently pat the wound dry with a clean towel.  Do not apply liquid medicine, cream medicine, or ointment medicine to your wound while the skin adhesive is in place. This may loosen the film before your wound is healed.  If a dressing is placed over the wound, be careful not to apply tape directly over the skin adhesive. This may cause the adhesive to be pulled off before the wound is healed.  Avoid prolonged exposure to sunlight or tanning lamps while the skin adhesive is in place. Exposure to ultraviolet light in the first year will darken the  scar.  The skin adhesive will usually remain in place for 5 to 10 days, then naturally fall off the skin. Do not pick at the adhesive film. You may need a tetanus shot if:  You cannot remember when you had your last tetanus shot.  You have never had a tetanus shot. If you get a tetanus shot, your arm may swell, get red, and feel warm to the touch. This is common and not a problem. If you need a tetanus shot and you choose not to have one, there is a rare chance of getting tetanus. Sickness from tetanus can be serious. SEEK MEDICAL CARE IF:   You have redness,  swelling, or increasing pain in the wound.  You see a red line that goes away from the wound.  You have yellowish-white fluid (pus) coming from the wound.  You have a fever.  You notice a bad smell coming from the wound or dressing.  Your wound breaks open before or after sutures have been removed.  You notice something coming out of the wound such as wood or glass.  Your wound is on your hand or foot and you cannot move a finger or toe. SEEK IMMEDIATE MEDICAL CARE IF:   Your pain is not controlled with prescribed medicine.  You have severe swelling around the wound causing pain and numbness or a change in color in your arm, hand, leg, or foot.  Your wound splits open and starts bleeding.  You have worsening numbness, weakness, or loss of function of any joint around or beyond the wound.  You develop painful lumps near the wound or on the skin anywhere on your body. MAKE SURE YOU:   Understand these instructions.  Will watch your condition.  Will get help right away if you are not doing well or get worse. Document Released: 08/14/2005 Document Revised: 11/06/2011 Document Reviewed: 02/07/2011 [facility] Patient Information 2015 [place], [place]. This information is not intended to replace advice given to you by your health care provider. Make sure you discuss any questions you have with your health care provider.

## 2014-12-16 NOTE — ED Provider Notes (Signed)
CSN: [id number]     Arrival date & time 12/16/14  0935 History   None    Chief Complaint  Patient presents with  . Toe Injury   (Consider location/radiation/quality/duration/timing/severity/associated sxs/prior Treatment) HPI        39 year old female presents for evaluation of an injury to her right fifth toe. She was walking in the yard earlier when she fell and hit her foot on something causing a laceration under the toe. She has been unable to bear weight since then and she worries that she may have broken her toe as well. No numbness. She has mild swelling about the lateral portion of the foot. Bleeding has been controlled with direct pressure. 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  Past Medical History  Diagnosis Date  . H/O: pneumonia   . Unspecified cerebral artery occlusion with cerebral infarction   . Impacted cerumen   . Internal hemorrhoids without mention of complication   . 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 Dermatophytosis of nail   . Other acute otitis externa    Past Surgical History  Procedure Laterality Date  . 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 Neuroendoscopic placement / replacement ventricular catheter w/ attachment shunt / external drain  1995    for hydrocephaly  . Myringotomy  1979  . Adenoidectomy  1979  . Tonsillectomy  1993   Family History  Problem Relation Age of Onset  . Diabetes Mother   . Hypertension Mother   . Diabetes Maternal Aunt   . Diabetes Maternal Grandmother   . Hypertension Maternal Grandmother   . Heart disease Maternal Grandmother   . Cancer Maternal Grandfather   . Diabetes Paternal Grandmother    History  Substance Use Topics  . Smoking status: Never Smoker   . Smokeless tobacco: Not on file  . Alcohol Use: Yes     Comment: very rare   OB History    No data available     Review of  Systems  Musculoskeletal:       Right foot pain, see history of present illness  Skin: Positive for wound.  All other systems reviewed and are negative.   Allergies  Codeine  Home Medications   Prior to Admission medications   Medication Sig Start Date End Date Taking? Authorizing Provider  cabergoline (DOSTINEX) 0.5 MG tablet Take 0.25 mg by mouth 2 (two) times a week. Mondays and Thursdays    Yes Historical Provider, MD  [employer] FIBER GUMMIES 2.5 G CHEW Chew 1 capsule by mouth daily.     Yes Historical Provider, MD  docusate sodium (COLACE) 100 MG capsule Take 100 mg by mouth daily.     Yes Historical Provider, MD  levothyroxine (SYNTHROID, LEVOTHROID) 175 MCG tablet Take 175 mcg by mouth daily.     Yes Historical Provider, MD  metFORMIN (GLUCOPHAGE) 500 MG tablet Take 500 mg by mouth 2 (two) times daily with a meal. One in the am and two in pm   Yes Historical Provider, MD  naproxen (EC NAPROSYN) 500 MG EC tablet Take 1 tablet (500 mg total) by mouth 2 (two) times daily as needed. 02/16/11  Yes Ria Bush, MD  rosuvastatin (CRESTOR) 10 MG tablet Take 10 mg by mouth 2 (two) times a week. Mondays and Thursdays    Yes Historical Provider, MD  tolterodine (DETROL LA)  4 MG 24 hr capsule Take 4 mg by mouth daily.   Yes Historical Provider, MD  topiramate (TOPAMAX) 25 MG capsule Take 50 mg by mouth 2 (two) times daily.     Yes Historical Provider, MD  verapamil (CALAN-SR) 240 MG CR tablet Take 480 mg by mouth at bedtime.     Yes Historical Provider, MD  cefUROXime (CEFTIN) 250 MG tablet Take 1 tablet (250 mg total) by mouth 2 (two) times daily with a meal. 12/16/14   Liam Graham, PA-C  cholecalciferol (VITAMIN D) 1000 UNITS tablet Take 1,000 Units by mouth daily.    Historical Provider, MD  DULoxetine (CYMBALTA) 30 MG capsule Take 30 mg by mouth daily.      Historical Provider, MD  metroNIDAZOLE (METROGEL) 0.75 % vaginal gel Place 1 Applicatorful vaginally 2 (two) times daily. 10/30/12    Terrance Mass, MD   BP 106/59 mmHg  Pulse 71  Temp(Src) 97.5 F (36.4 C) (Oral)  Resp 12  SpO2 100%  LMP 12/13/2014 Physical Exam  Constitutional: She is oriented to person, place, and time. Vital signs are normal. She appears well-developed and well-nourished. No distress.  HENT:  Head: Normocephalic and atraumatic.  Pulmonary/Chest: Effort normal. No respiratory distress.  Musculoskeletal:       Right foot: There is tenderness (diffuse tenderness and swelling of the right fifth toe and distal fifth metatarsal), swelling and laceration (horizontal laceration of the right fifth toe, plantar surface, mid toe just proximal to the distal fat pad).  Neurological: She is alert and oriented to person, place, and time. She has normal strength. Coordination normal.  Skin: Skin is warm and dry. No rash noted. She is not diaphoretic.  Psychiatric: She has a normal mood and affect. Judgment normal.  Nursing note and vitals reviewed.   ED Course  Procedures (including critical care time) Labs Review Labs Reviewed - No data to display  Imaging Review Dg Foot Complete Right  12/16/2014   CLINICAL DATA:  39 year old female who fell in the yard today with a right foot injury including laceration under the fifth toe. Initial encounter.  EXAM: RIGHT FOOT COMPLETE - 3+ VIEW  COMPARISON:  None.  FINDINGS: Bone mineralization is within normal limits. Calcaneus intact. Joint spaces and alignment within normal limits. No fracture or dislocation identified. No subcutaneous gas identified. No radiopaque foreign body identified.  IMPRESSION: No acute osseous abnormality identified in the right foot.   Electronically Signed   By: Genevie Ann M.D.   On: 12/16/2014 11:26     MDM   1. Laceration of fifth toe, right, initial encounter    No fracture. This is right in the crease that is just proximal to the distal fat pad of the toe. The skin is naturally close together, I do not think she will benefit from  sutures. The wound was rinsed with 1 L of normal saline, small amount of buttock one was applied, and a Band-Aid was placed across the toe to hold the toe down, this will also hold the laceration closed. We will put her in a postop shoe to prevent bending of the toe, standard wound care, antibiotics to prevent infection, follow-up when necessary if any worsening or any signs of infection    Meds ordered this encounter  Medications  . chlorhexidine (HIBICLENS) 4 % liquid    Sig:   . Tdap (BOOSTRIX) injection 0.5 mL    Sig:   . cefUROXime (CEFTIN) 250 MG tablet    Sig:  Take 1 tablet (250 mg total) by mouth 2 (two) times daily with a meal.    Dispense:  10 tablet    Refill:  0       Liam Graham, PA-C 12/16/14 1157

## 2014-12-16 NOTE — ED Notes (Signed)
Caregiver brings pt in for right foot/toes inj and laceration onset 0900  Pt has hx of brain inj/short memory loss; does not recall how she fell Alert, no signs of acute distress.

## 2015-07-05 ENCOUNTER — Encounter: Payer: Self-pay | Admitting: Family Medicine

## 2015-07-05 ENCOUNTER — Ambulatory Visit (INDEPENDENT_AMBULATORY_CARE_PROVIDER_SITE_OTHER): Payer: Medicaid Other | Admitting: Family Medicine

## 2015-07-05 VITALS — BP 100/60 | HR 75 | Temp 97.5°F | Ht 68.0 in | Wt 272.5 lb

## 2015-07-05 DIAGNOSIS — R269 Unspecified abnormalities of gait and mobility: Secondary | ICD-10-CM | POA: Diagnosis not present

## 2015-07-05 DIAGNOSIS — S069X0S Unspecified intracranial injury without loss of consciousness, sequela: Secondary | ICD-10-CM

## 2015-07-05 DIAGNOSIS — M25561 Pain in right knee: Secondary | ICD-10-CM | POA: Diagnosis not present

## 2015-07-05 NOTE — Patient Instructions (Signed)
Alleve 2 tabs by mouth two times a day over the counter: Take at least for 2 - 3 weeks. This is equal to a prescripton strength dose (GENERIC CHEAPER EQUIVALENT IS NAPROXEN SODIUM)   Voltaren gel topically 4 times a day  Gait training and Knee pain PT.

## 2015-07-05 NOTE — Progress Notes (Signed)
Pre visit review using our clinic review tool, if applicable. No additional management support is needed unless otherwise documented below in the visit note. 

## 2015-07-05 NOTE — Progress Notes (Signed)
Dr. Frederico Hamman T. Mariadejesus Cade, MD, [facility] [place], [address] Phone: [phone] Fax: [phone]  07/05/2015  Patient: Christine Koch, MRN: [id number], DOB: 06-18-1976, 39 y.o.  Primary Physician:  Eliezer Lofts, MD   Chief Complaint  Patient presents with  . Knee Pain    Right   Subjective:   Christine Koch is a 39 y.o. very pleasant female patient who presents with the following:  Daughter of Christine Koch, RT  Anterior and medial knee pain, hurts to go up and down stairs. If eases up on it and moving it will hurt a lot.  Voltaren gel helped some over the weekend. She does fall and have injuries and direct trauma to the joint multiple times over the last few weeks. She does have a mild effusion only. She does have pain getting up and rising from a seated position. Twisting and extremes of motion also heard. She is not having any mechanical symptoms at all.  Her gait is off some, she did have some disturbance of her gait after her stroke about 20 years ago on the right side.  Past Medical History, Surgical History, Social History, Family History, Problem List, Medications, and Allergies have been reviewed and updated if relevant.  Patient Active Problem List   Diagnosis Date Noted  . Nocturia 10/30/2012  . Urgency incontinence 10/30/2012  . 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 Injury brain, traumatic ([place]) 08/10/2011  . Migraine 08/10/2011  . HEMORRHOIDS, INTERNAL 06/15/2010  . ONYCHOMYCOSIS, TOENAILS 05/07/2007  . 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 CEREBROVASCULAR ACCIDENT 11/22/2006  . PNEUMONIA, HX OF 11/22/2006    Past Medical History  Diagnosis Date  . H/O: pneumonia   . Unspecified cerebral artery occlusion with cerebral infarction   . Impacted cerumen   . Internal hemorrhoids without mention of complication   . 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  Dermatophytosis of nail   . Other acute otitis externa     Past Surgical History  Procedure Laterality Date  . 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 Neuroendoscopic placement / replacement ventricular catheter w/ attachment shunt / external drain  1995    for hydrocephaly  . Myringotomy  1979  . Adenoidectomy  1979  . Tonsillectomy  1993    Social History   Social History  . Marital Status: Single    Spouse Name: N/A  . Number of Children: N/A  . Years of Education: N/A   Occupational History  . Not on file.   Social History Main Topics  . Smoking status: Never Smoker   . Smokeless tobacco: Never Used  . Alcohol Use: 0.0 oz/week    0 Standard drinks or equivalent per week     Comment: very rare  . Drug Use: No  . Sexual Activity: No   Other Topics Concern  . Not on file   Social History Narrative    Family History  Problem Relation Age of Onset  . Diabetes Mother   . Hypertension Mother   . Diabetes Maternal Aunt   . Diabetes Maternal Grandmother   . Hypertension Maternal Grandmother   . Heart disease Maternal Grandmother   . Cancer Maternal Grandfather   . Diabetes Paternal Grandmother     Allergies  Allergen Reactions  . Codeine     REACTION: Vomiting    Medication list reviewed and updated in full  in [facility].  GEN: No fevers, chills. Nontoxic. Primarily MSK c/o today. MSK: Detailed in the HPI GI: tolerating PO intake without difficulty Neuro: No numbness, parasthesias, or tingling associated. Otherwise the pertinent positives of the ROS are noted above.   Objective:   BP 100/60 mmHg  Pulse 75  Temp(Src) 97.5 F (36.4 C) (Oral)  Ht 5\' 8"  (1.727 m)  Wt 272 lb 8 oz (123.605 kg)  BMI 41.44 kg/m2  LMP 07/02/2015   GEN: WDWN, NAD, Non-toxic, Alert & Oriented x 3 HEENT: Atraumatic, Normocephalic.  Ears and Nose: No external deformity. EXTR: No clubbing/cyanosis/edema NEURO: Normal gait.  PSYCH: Normally interactive.  Conversant. Not depressed or anxious appearing.  Calm demeanor.   Knee:  R Gait: Normal heel toe pattern ROM: 0-115 Effusion: neg Echymosis or edema: none Patellar tendon NT Painful PLICA: neg Patellar grind: negative Medial and lateral patellar facet loading: negative medial and lateral joint lines: medial Mcmurray's neg Flexion-pinch neg Varus and valgus stress: stable Lachman: neg Ant and Post drawer: neg Hip abduction, IR, ER: WNL Hip flexion str: 5/5 Hip abd: 5/5 Quad: 5/5 VMO atrophy:No Hamstring concentric and eccentric: 5/5   Radiology: X-rays: AP, Rosenberg Weight-bearing, Weightbearing Lateral Indication: knee pain Findings:  Osteoarthritic changes are most no one in the medial compartment nonweightbearing films. There is no fracture. There is no dislocation. Sunrise view or emergent view was not obtained. The radiological images were independently reviewed by myself in the office and results were reviewed with the patient. My independent interpretation of images:  Electronically Signed  By: Owens Loffler, MD On: 07/05/2015 1:59 PM   Assessment and Plan:   Right knee pain  Abnormality of gait  Injury brain, traumatic, without loss of consciousness, sequela (HCC)  Medial joint line tenderness without meniscal signs on McMurray's or flexion pinch. Differential would include medial meniscal tear, bruising, or potentially arthritis exacerbation. Ligamentous structures are intact.  Gait disturbance is likely increasing risk of falls and increasing risks of further knee injury long-term. Recommended formal physical therapy for gait training and balance.  Continue anti-inflammatories and we will do an intra-articular injection to try to calm down her knee. If symptoms persist, then other advanced imaging may be indicated to definitively assess for internal derangement.  Knee Injection, RIGHT Patient verbally consented to procedure. Risks (including potential rare risk  of infection), benefits, and alternatives explained. Sterilely prepped with Chloraprep. Ethyl cholride used for anesthesia. 8 cc Lidocaine 1% mixed with 2 mL Depo-Medrol 40 mg injected using the anteromedial approach without difficulty. No complications with procedure and tolerated well. Patient had decreased pain post-injection.   Follow-up: if not improving  Signed,  Bowie Doiron T. Rahim Astorga, MD   Patient's Medications  New Prescriptions   No medications on file  Previous Medications   CABERGOLINE (DOSTINEX) 0.5 MG TABLET    Take 0.25 mg by mouth 2 (two) times a week. Mondays and Thursdays    CHOLECALCIFEROL (VITAMIN D) 1000 UNITS TABLET    Take 1,000 Units by mouth daily.   [employer] FIBER GUMMIES 2.5 G CHEW    Chew 1 capsule by mouth daily.     DOCUSATE SODIUM (COLACE) 100 MG CAPSULE    Take 100 mg by mouth daily.     DULOXETINE (CYMBALTA) 30 MG CAPSULE    Take 30 mg by mouth daily.     LEVOTHYROXINE (SYNTHROID, LEVOTHROID) 175 MCG TABLET    Take 175 mcg by mouth daily.     METFORMIN (GLUCOPHAGE) 500 MG TABLET  Take 500 mg by mouth 2 (two) times daily with a meal. One in the am and two in pm   NAPROXEN (EC NAPROSYN) 500 MG EC TABLET    Take 1 tablet (500 mg total) by mouth 2 (two) times daily as needed.   ROSUVASTATIN (CRESTOR) 10 MG TABLET    Take 10 mg by mouth 2 (two) times a week. Mondays and Thursdays    TOLTERODINE (DETROL LA) 4 MG 24 HR CAPSULE    Take 4 mg by mouth daily.   TOPIRAMATE (TOPAMAX) 25 MG CAPSULE    Take 50 mg by mouth 2 (two) times daily.     VERAPAMIL (CALAN-SR) 240 MG CR TABLET    Take 480 mg by mouth at bedtime.    Modified Medications   No medications on file  Discontinued Medications   CEFUROXIME (CEFTIN) 250 MG TABLET    Take 1 tablet (250 mg total) by mouth 2 (two) times daily with a meal.   METRONIDAZOLE (METROGEL) 0.75 % VAGINAL GEL    Place 1 Applicatorful vaginally 2 (two) times daily.

## 2015-07-14 ENCOUNTER — Encounter: Payer: Self-pay | Admitting: Family Medicine

## 2015-07-14 ENCOUNTER — Ambulatory Visit (INDEPENDENT_AMBULATORY_CARE_PROVIDER_SITE_OTHER): Payer: Medicaid Other | Admitting: Family Medicine

## 2015-07-14 ENCOUNTER — Ambulatory Visit: Payer: Medicaid Other | Admitting: Family Medicine

## 2015-07-14 VITALS — BP 98/70 | HR 68 | Temp 97.3°F | Ht 68.0 in | Wt 269.0 lb

## 2015-07-14 DIAGNOSIS — M25561 Pain in right knee: Secondary | ICD-10-CM | POA: Diagnosis not present

## 2015-07-14 DIAGNOSIS — R269 Unspecified abnormalities of gait and mobility: Secondary | ICD-10-CM | POA: Diagnosis not present

## 2015-07-14 DIAGNOSIS — S069X0S Unspecified intracranial injury without loss of consciousness, sequela: Secondary | ICD-10-CM | POA: Diagnosis not present

## 2015-07-14 LAB — CBC WITH DIFFERENTIAL/PLATELET
BASOS ABS: 0 10*3/uL (ref 0.0–0.1)
Basophils Relative: 0.3 % (ref 0.0–3.0)
EOS ABS: 0.3 10*3/uL (ref 0.0–0.7)
Eosinophils Relative: 3.3 % (ref 0.0–5.0)
HEMATOCRIT: 40.6 % (ref 36.0–46.0)
Hemoglobin: 13.2 g/dL (ref 12.0–15.0)
LYMPHS ABS: 3.1 10*3/uL (ref 0.7–4.0)
LYMPHS PCT: 31.7 % (ref 12.0–46.0)
MCHC: 32.4 g/dL (ref 30.0–36.0)
MCV: 93.8 fl (ref 78.0–100.0)
Monocytes Absolute: 0.7 10*3/uL (ref 0.1–1.0)
Monocytes Relative: 6.8 % (ref 3.0–12.0)
NEUTROS ABS: 5.6 10*3/uL (ref 1.4–7.7)
NEUTROS PCT: 57.9 % (ref 43.0–77.0)
PLATELETS: 269 10*3/uL (ref 150.0–400.0)
RBC: 4.33 Mil/uL (ref 3.87–5.11)
RDW: 13.3 % (ref 11.5–15.5)
WBC: 9.7 10*3/uL (ref 4.0–10.5)

## 2015-07-14 LAB — SEDIMENTATION RATE: SED RATE: 18 mm/h (ref 0–22)

## 2015-07-14 LAB — URIC ACID: Uric Acid, Serum: 5.7 mg/dL (ref 2.4–7.0)

## 2015-07-14 LAB — HIGH SENSITIVITY CRP: CRP HIGH SENSITIVITY: 4.87 mg/L (ref 0.000–5.000)

## 2015-07-14 MED ORDER — DICLOFENAC SODIUM 75 MG PO TBEC: 75.0000 mg | DELAYED_RELEASE_TABLET | Freq: Two times a day (BID) | ORAL | Status: DC

## 2015-07-14 NOTE — Progress Notes (Signed)
Dr. Frederico Hamman T. Charlee Squibb, MD, [facility] and Sports Medicine [place], [address] Phone: [phone] Fax: [phone]  07/14/2015  Patient: Christine Koch, MRN: [id number], DOB: 1975/09/15, 39 y.o.  Primary Physician:  Eliezer Lofts, MD   Chief Complaint  Patient presents with  . Knee Pain    Right-Pain is worse   Subjective:   Christine Koch is a 39 y.o. very pleasant female patient who presents with the following:  I saw the patient 9 days ago, and at that point she was having intermittent pain on the order of months. No known trauma, but the patient has had a TBI, and recall is impaired. At last OV, we did an intraarticular knee injection, and she felt quite a bit better for only a couple of days. At that point, she was having some medial joint line pain concerning for potential meniscal pathology. PT for gait recommended, but did not start due to worsened pain.  Now trouble walking cringing with weightbearing - decidedly different.  She points to the anterior aspect of her knee as the most painful region.    No fever, chills, sweats. No joint effusion, redness or warmth with the right knee.   07/05/2015 Last OV with Owens Loffler, MD  Daughter of Christine Koch, RT  Anterior and medial knee pain, hurts to go up and down stairs. If eases up on it and moving it will hurt a lot.  Voltaren gel helped some over the weekend. She does fall and have injuries and direct trauma to the joint multiple times over the last few weeks. She does have a mild effusion only. She does have pain getting up and rising from a seated position. Twisting and extremes of motion also heard. She is not having any mechanical symptoms at all.  Her gait is off some, she did have some disturbance of her gait after her stroke about 20 years ago on the right side.  Past Medical History, Surgical History, Social History, Family History, Problem List, Medications, and  Allergies have been reviewed and updated if relevant.  Patient Active Problem List   Diagnosis Date Noted  . Nocturia 10/30/2012  . Urgency incontinence 10/30/2012  . 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 Migraine 08/10/2011  . HEMORRHOIDS, INTERNAL 06/15/2010  . ONYCHOMYCOSIS, TOENAILS 05/07/2007  . 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 CEREBROVASCULAR ACCIDENT 11/22/2006  . PNEUMONIA, HX OF 11/22/2006    Past Medical History  Diagnosis Date  . H/O: pneumonia   . Unspecified cerebral artery occlusion with cerebral infarction   . Impacted cerumen   . Internal hemorrhoids without mention of complication   . 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 Dermatophytosis of nail   . Other acute otitis externa     Past Surgical History  Procedure Laterality Date  . 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 Neuroendoscopic placement / replacement ventricular catheter w/ attachment shunt / external drain  1995    for hydrocephaly  . Myringotomy  1979  . Adenoidectomy  1979  . Tonsillectomy  1993    Social History   Social History  . Marital Status: Single    Spouse Name: N/A  . Number of Children: N/A  . Years of Education: N/A   Occupational History  . Not on file.   Social History Main Topics  . Smoking status: Never Smoker   .  Smokeless tobacco: Never Used  . Alcohol Use: 0.0 oz/week    0 Standard drinks or equivalent per week     Comment: very rare  . Drug Use: No  . Sexual Activity: No   Other Topics Concern  . Not on file   Social History Narrative    Family History  Problem Relation Age of Onset  . Diabetes Mother   . Hypertension Mother   . Diabetes Maternal Aunt   . Diabetes Maternal Grandmother   . Hypertension Maternal Grandmother   . Heart disease Maternal Grandmother   . Cancer Maternal Grandfather   . Diabetes Paternal Grandmother     Allergies  Allergen  Reactions  . Codeine     REACTION: Vomiting    Medication list reviewed and updated in full in [facility].  GEN: No fevers, chills. Nontoxic. Primarily MSK c/o today. MSK: Detailed in the HPI GI: tolerating PO intake without difficulty Neuro: No numbness, parasthesias, or tingling associated. Otherwise the pertinent positives of the ROS are noted above.   Objective:   BP 98/70 mmHg  Pulse 68  Temp(Src) 97.3 F (36.3 C) (Oral)  Ht 5' 8"  (1.727 m)  Wt 269 lb (122.018 kg)  BMI 40.91 kg/m2  LMP 07/02/2015   GEN: WDWN, NAD, Non-toxic, Alert & Oriented x 3 HEENT: Atraumatic, Normocephalic.  Ears and Nose: No external deformity. EXTR: No clubbing/cyanosis/edema NEURO: Normal gait.  PSYCH: Normally interactive. Conversant. Not depressed or anxious appearing.  Calm demeanor.   Knee:  R Gait: Markedly antalgic gait favoring the R side ROM: 0-110 Effusion: neg Echymosis or edema: none Patellar tendon Dramatically tender to palpate the superior patellar tendon at insertion to patella, point of maximal tenderness Painful PLICA: neg Patellar grind: negative Medial and lateral patellar facet loading: mild tenderness medial and lateral joint lines: medial Flexion-pinch pos Varus and valgus stress: stable Lachman: neg Ant and Post drawer: neg Hip abduction, IR, ER: WNL Hip flexion str: 5/5 Hip abd: 5/5 Quad: 5/5 VMO atrophy:No Hamstring concentric and eccentric: 5/5   Radiology: X-rays: AP, Rosenberg Weight-bearing, Weightbearing Lateral Indication: knee pain Findings:  Osteoarthritic changes are most no one in the medial compartment nonweightbearing films. There is no fracture. There is no dislocation. Sunrise view or emergent view was not obtained. The radiological images were independently reviewed by myself in the office and results were reviewed with the patient. My independent interpretation of images:  Electronically Signed  By: Owens Loffler, MD On:  07/05/2015  Diagnostic Ultrasound Evaluation Terason t3000, MSK ultrasound, MSK probe Anatomy scanned: R patella Indication: Pain Findings: longitudinal and transverse images obtained. Better seen on transverse image, there was a hypoechoic change in the body of the proximal patellar tendon near point of maximal tenderness. There was adjacent anechoic change to the tendon most c/w fluid. Most likely represent proximal patellar tendon injury. Tendon is intact to attachment. Electronic difficulties saving images, but reviewed with patient and mother, who is an RT.  Electronically Signed  By: Owens Loffler, MD On: 07/14/2015 2:06 PM   Assessment and Plan:   Acute knee pain, right - Plan: High sensitivity CRP, Sedimentation rate, CBC with Differential/Platelet, Uric acid  Abnormality of gait  Injury brain, traumatic, without loss of consciousness, sequela ([place])  With history and exam, infection seems unlikely, but we will check CBC, ESR, CRP. Uric acid give acuity.   On ultrasound concern for patellar tendon injury. Pain at distal patella. Given level of change in functionality and gait, recommend obtaining an  MRI of the knee without contrast to evaluate for potential occult fracture of the patella, distal femur, or tibial plateau.   Follow-up: depending on outcome of studies.   New Prescriptions   DICLOFENAC (VOLTAREN) 75 MG EC TABLET    Take 1 tablet (75 mg total) by mouth 2 (two) times daily.   Modified Medications   No medications on file   Orders Placed This Encounter  Procedures  . High sensitivity CRP  . Sedimentation rate  . CBC with Differential/Platelet  . Uric acid    Signed,  Lajarvis Italiano T. Analiza Cowger, MD   Patient's Medications  New Prescriptions   DICLOFENAC (VOLTAREN) 75 MG EC TABLET    Take 1 tablet (75 mg total) by mouth 2 (two) times daily.  Previous Medications   CABERGOLINE (DOSTINEX) 0.5 MG TABLET    Take 0.25 mg by mouth 2 (two) times a week. Mondays and  Thursdays    CHOLECALCIFEROL (VITAMIN D) 1000 UNITS TABLET    Take 1,000 Units by mouth daily.   [employer] FIBER GUMMIES 2.5 G CHEW    Chew 1 capsule by mouth daily.     DOCUSATE SODIUM (COLACE) 100 MG CAPSULE    Take 100 mg by mouth daily.     DULOXETINE (CYMBALTA) 30 MG CAPSULE    Take 30 mg by mouth daily.     LEVOTHYROXINE (SYNTHROID, LEVOTHROID) 175 MCG TABLET    Take 175 mcg by mouth daily.     METFORMIN (GLUCOPHAGE) 500 MG TABLET    Take 500 mg by mouth 2 (two) times daily with a meal. One in the am and two in pm   NAPROXEN (EC NAPROSYN) 500 MG EC TABLET    Take 1 tablet (500 mg total) by mouth 2 (two) times daily as needed.   ROSUVASTATIN (CRESTOR) 10 MG TABLET    Take 10 mg by mouth 2 (two) times a week. Mondays and Thursdays    TOLTERODINE (DETROL LA) 4 MG 24 HR CAPSULE    Take 4 mg by mouth daily.   TOPIRAMATE (TOPAMAX) 25 MG CAPSULE    Take 50 mg by mouth 2 (two) times daily.     VERAPAMIL (CALAN-SR) 240 MG CR TABLET    Take 480 mg by mouth at bedtime.    Modified Medications   No medications on file  Discontinued Medications   No medications on file

## 2015-07-14 NOTE — Progress Notes (Signed)
Pre visit review using our clinic review tool, if applicable. No additional management support is needed unless otherwise documented below in the visit note. 

## 2015-07-16 ENCOUNTER — Other Ambulatory Visit: Payer: Self-pay | Admitting: Endocrinology

## 2015-07-16 DIAGNOSIS — M25561 Pain in right knee: Secondary | ICD-10-CM

## 2015-07-31 ENCOUNTER — Ambulatory Visit
Admission: RE | Admit: 2015-07-31 | Discharge: 2015-07-31 | Disposition: A | Discharge status: Home / Self Care | Payer: Medicaid Other | Source: Ambulatory Visit | Attending: Endocrinology | Admitting: Endocrinology

## 2015-07-31 DIAGNOSIS — M25561 Pain in right knee: Secondary | ICD-10-CM

## 2015-08-04 ENCOUNTER — Ambulatory Visit (INDEPENDENT_AMBULATORY_CARE_PROVIDER_SITE_OTHER): Payer: Medicaid Other | Admitting: Gynecology

## 2015-08-04 ENCOUNTER — Encounter: Payer: Self-pay | Admitting: Gynecology

## 2015-08-04 VITALS — BP 126/82 | Ht 67.5 in | Wt 270.0 lb

## 2015-08-04 DIAGNOSIS — Z01419 Encounter for gynecological examination (general) (routine) without abnormal findings: Secondary | ICD-10-CM

## 2015-08-04 DIAGNOSIS — Z0000 Encounter for general adult medical examination without abnormal findings: Secondary | ICD-10-CM | POA: Diagnosis not present

## 2015-08-04 NOTE — Progress Notes (Signed)
ZEPLYN VROMAN 11/09/1975 [id number]   History:    39 y.o.  for annual gyn exam with no complaints today presented to the office with her mother. Patient reports normal menstrual cycles. 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 Reynold Bowen who has been treating her as well for hypothyroidism as well as hyper prolactinemia and hypercholesterolemia, and vitamin D deficiency. Patient has suffered from overactive bladder and is now on oral anticholinergic. See medication list as well as past medical history for additional details. Patient with no prior history of abnormal Pap smears.  Past medical history,surgical history, family history and social history were all reviewed and documented in the EPIC chart.  Gynecologic History Patient's last menstrual period was 07/26/2015. Contraception: none Last Pap: 2014. Results were: normal Last mammogram: Not indicated. Results were: Not indicated  Obstetric History OB History  No data available     ROS: A ROS was performed and pertinent positives and negatives are included in the history.  GENERAL: No fevers or chills. HEENT: No change in vision, no earache, sore throat or sinus congestion. NECK: No pain or stiffness. CARDIOVASCULAR: No chest pain or pressure. No palpitations. PULMONARY: No shortness of breath, cough or wheeze. GASTROINTESTINAL: No abdominal pain, nausea, vomiting or diarrhea, melena or bright red blood per rectum. GENITOURINARY: No urinary frequency, urgency, hesitancy or dysuria. MUSCULOSKELETAL: No joint or muscle pain, no back pain, no recent trauma. DERMATOLOGIC: No rash, no itching, no lesions. ENDOCRINE: No polyuria, polydipsia, no heat or cold intolerance. No recent change in weight. HEMATOLOGICAL: No anemia or easy bruising or bleeding. NEUROLOGIC: No headache,  seizures, numbness, tingling or weakness. PSYCHIATRIC: No depression, no loss of interest in normal activity or change in sleep pattern.     Exam: chaperone present  BP 126/82 mmHg  Ht 5' 7.5" (1.715 m)  Wt 270 lb (122.471 kg)  BMI 41.64 kg/m2  LMP 07/26/2015  Body mass index is 41.64 kg/(m^2).  General appearance : Well developed well nourished female. No acute distress HEENT: Eyes: no retinal hemorrhage or exudates,  Neck supple, trachea midline, no carotid bruits, no thyroidmegaly Lungs: Clear to auscultation, no rhonchi or wheezes, or rib retractions  Heart: Regular rate and rhythm, no murmurs or gallops Breast:Examined in sitting and supine position were symmetrical in appearance, no palpable masses or tenderness,  no skin retraction, no nipple inversion, no nipple discharge, no skin discoloration, no axillary or supraclavicular lymphadenopathy Abdomen: no palpable masses or tenderness, no rebound or guarding Extremities: no edema or skin discoloration or tenderness  Pelvic:  Bartholin, Urethra, Skene Glands: Within normal limits             Vagina: No gross lesions or discharge  Cervix: No gross lesions or discharge  Uterus  anteverted, normal size, shape and consistency, non-tender and mobile  Adnexa  Without masses or tenderness  Anus and perineum  normal   Rectovaginal  normal sphincter tone without palpated masses or tenderness             Hemoccult not indicated     Assessment/Plan:  39 y.o. female with normal annual GYN exam. Patient was instructed to do her monthly breast exam. She will need a Pap smear next year according to the guidelines. Her PCP an endocrinologist as indicated above are following her and doing her blood work. Her vaccines are up-to-date.  Terrance Mass MD, 9:57 AM 08/04/2015

## 2015-11-03 ENCOUNTER — Ambulatory Visit (INDEPENDENT_AMBULATORY_CARE_PROVIDER_SITE_OTHER)
Admission: RE | Admit: 2015-11-03 | Discharge: 2015-11-03 | Disposition: A | Discharge status: Home / Self Care | Payer: Medicaid Other | Source: Ambulatory Visit | Attending: Family Medicine | Admitting: Family Medicine

## 2015-11-03 ENCOUNTER — Ambulatory Visit (INDEPENDENT_AMBULATORY_CARE_PROVIDER_SITE_OTHER): Payer: Medicaid Other | Admitting: Family Medicine

## 2015-11-03 ENCOUNTER — Encounter: Payer: Self-pay | Admitting: Family Medicine

## 2015-11-03 VITALS — BP 110/78 | HR 71 | Temp 97.5°F | Ht 67.5 in | Wt 273.5 lb

## 2015-11-03 DIAGNOSIS — S92912A Unspecified fracture of left toe(s), initial encounter for closed fracture: Secondary | ICD-10-CM | POA: Diagnosis not present

## 2015-11-03 DIAGNOSIS — M25561 Pain in right knee: Secondary | ICD-10-CM | POA: Diagnosis not present

## 2015-11-03 DIAGNOSIS — M7651 Patellar tendinitis, right knee: Secondary | ICD-10-CM

## 2015-11-03 DIAGNOSIS — M79675 Pain in left toe(s): Secondary | ICD-10-CM | POA: Diagnosis not present

## 2015-11-03 DIAGNOSIS — S069X9S Unspecified intracranial injury with loss of consciousness of unspecified duration, sequela: Secondary | ICD-10-CM

## 2015-11-03 NOTE — Progress Notes (Signed)
Pre visit review using our clinic review tool, if applicable. No additional management support is needed unless otherwise documented below in the visit note. 

## 2015-11-03 NOTE — Progress Notes (Signed)
Dr. Frederico Hamman T. Latoy Labriola, MD, [facility] [place], [address] Phone: [phone] Fax: [phone]  11/03/2015  Patient: Christine Koch, MRN: [id number], DOB: 1976/02/28, 40 y.o.  Primary Physician:  Sheela Stack, MD   Chief Complaint  Patient presents with  . Knee Pain    Right  . Foot Pain    Left   Subjective:   NADA MEKEEL is a 40 y.o. very pleasant female patient who presents with the following:  I know the patient well.  I'm seeing her at the request of her mother for evaluation of ongoing worsening knee pain and probable left second digit fracture on the foot.  Her mother became quite concerned when over the course of the last week or so there have been times where the patient needed to use a walker to get around the house.  She has had multiple falls on both of her knees.  Previously in December, the patient injured her knee, and she had very symptomatic symptoms and examination, and we obtained an MRI of her right knee.  No significant internal derangement but very severe patellar tendinopathy. At that point, the patient was started on some oral Voltaren and icing, and she did remarkably well.  Unfortunately, she developed some rectal bleeding and had to be discontinued from NSAIDs.  At this point primary pain is anterior at the proximal patellar tendon insertion on the patella.  She also has a swollen black and blue second toe on the left, and exact onset of injury is unclear.  Past Medical History, Surgical History, Social History, Family History, Problem List, Medications, and Allergies have been reviewed and updated if relevant.  Patient Active Problem List   Diagnosis Date Noted  . Nocturia 10/30/2012  . Urgency incontinence 10/30/2012  . 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 Injury brain, traumatic ([place]) 08/10/2011  . Migraine 08/10/2011  . HEMORRHOIDS, INTERNAL 06/15/2010  . ONYCHOMYCOSIS,  TOENAILS 05/07/2007  . 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 CEREBROVASCULAR ACCIDENT 11/22/2006  . PNEUMONIA, HX OF 11/22/2006    Past Medical History  Diagnosis Date  . H/O: pneumonia   . Unspecified cerebral artery occlusion with cerebral infarction   . Impacted cerumen   . Internal hemorrhoids without mention of complication   . 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 Dermatophytosis of nail   . Other acute otitis externa     Past Surgical History  Procedure Laterality Date  . 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 Neuroendoscopic placement / replacement ventricular catheter w/ attachment shunt / external drain  1995    for hydrocephaly  . Myringotomy  1979  . Adenoidectomy  1979  . Tonsillectomy  1993    Social History   Social History  . Marital Status: Single    Spouse Name: N/A  . Number of Children: N/A  . Years of Education: N/A   Occupational History  . Not on file.   Social History Main Topics  . Smoking status: Never Smoker   . Smokeless tobacco: Never Used  . Alcohol Use: 0.0 oz/week    0 Standard drinks or equivalent per week     Comment: very rare  . Drug Use: No  . Sexual Activity: No   Other Topics Concern  . Not on file   Social History Narrative    Family History  Problem Relation Age of Onset  .  Diabetes Mother   . Hypertension Mother   . Diabetes Maternal Aunt   . Diabetes Maternal Grandmother   . Hypertension Maternal Grandmother   . Heart disease Maternal Grandmother   . Cancer Maternal Grandfather   . Diabetes Paternal Grandmother     Allergies  Allergen Reactions  . Codeine     REACTION: Vomiting    Medication list reviewed and updated in full in [place].  GEN: No fevers, chills. Nontoxic. Primarily MSK c/o today. MSK: Detailed in the HPI GI: tolerating PO intake without difficulty Neuro: No numbness, parasthesias, or tingling associated. Otherwise the  pertinent positives of the ROS are noted above.   Objective:   BP 110/78 mmHg  Pulse 71  Temp(Src) 97.5 F (36.4 C) (Oral)  Ht 5' 7.5" (1.715 m)  Wt 273 lb 8 oz (124.059 kg)  BMI 42.18 kg/m2  LMP 10/23/2015   GEN: WDWN, NAD, Non-toxic, Alert & Oriented x 3 HEENT: Atraumatic, Normocephalic.  Ears and Nose: No external deformity. EXTR: No clubbing/cyanosis/edema PSYCH: Normally interactive. Conversant. Not depressed or anxious appearing.  Calm demeanor.   Knee:  R Gait: Normal heel toe pattern, mild antalgia ROM: 0-115 Effusion: neg Echymosis or edema: none Patellar tendon MARKEDLY TENDER AT THE PROXIMAL PATELLAR TENDON INSERTION ON PATELLA Painful PLICA: neg Patellar grind: negative Medial and lateral patellar facet loading: negative medial and lateral joint lines: MINIMAL TO MILDLY TTP Mcmurray's neg Flexion-pinch neg Varus and valgus stress: stable Lachman: neg Ant and Post drawer: neg Hip abduction, IR, ER: WNL Hip flexion str: 5/5 Hip abd: 5/5 Quad: 5/5 VMO atrophy:No Hamstring concentric and eccentric: 5/5   Left second toe is dramatically swollen compared others with bruising throughout its entirety.  All of the remaining toes are nontender and all metatarsals are nontender.  Radiology: Dg Toe 2nd Left  11/04/2015  CLINICAL DATA:  Injury.  Left second toe pain.  Initial evaluation. EXAM: LEFT SECOND TOE COMPARISON:  None. FINDINGS: Slightly angulated fracture of the distal aspect of the proximal phalanx of the left second digit noted. Extension into the proximal interphalangeal joint space cannot be completely excluded . No radiopaque foreign bodies. No other focal abnormality . IMPRESSION: Slightly angulated fracture of the distal aspect of the proximal phalanx of left second digit. Extension into the proximal interphalangeal joint space cannot be completely excluded. Electronically Signed   By: Marcello Moores  Register   On: 11/04/2015 08:22     Assessment and Plan:    Patellar tendonitis, right  Toe pain, left - Plan: DG Toe 2nd Left  Acute knee pain, right  Toe fracture, left, closed, initial encounter  The second digit fracture is stable.  This will heal likely uneventfully, but I did recommend trying to Buddy tape with an adjacent digit for stability.  Quite significant patellar tendinopathy, clinically and also radiographically from prior MRI which was reviewed again.  Recommend physical therapy with attention to the right knee and an emphasis on eccentric quadricep training and Decadron iontophoresis directly to the patellar tendon.  For Iontophoresis: my typical prescription for iontophoresis  Dexamethasone Sodium Phosphate, 4 mg/mL, 45mL SIG: Apply as directed in physical therapy script for duration of physical therapy at therapist discretion   Follow-up: after course of PT  Orders Placed This Encounter  Procedures  . DG Toe 2nd Left    Signed,  Yoneko Talerico T. Jim Lundin, MD   Patient's Medications  New Prescriptions   No medications on file  Previous Medications   CABERGOLINE (DOSTINEX) 0.5 MG  TABLET    Take 0.25 mg by mouth 2 (two) times a week. Mondays and Thursdays    CHOLECALCIFEROL (VITAMIN D) 1000 UNITS TABLET    Take 1,000 Units by mouth daily.   DOCUSATE SODIUM (COLACE) 100 MG CAPSULE    Take 100 mg by mouth daily.     DULOXETINE (CYMBALTA) 30 MG CAPSULE    Take 30 mg by mouth daily.     LEVOTHYROXINE (SYNTHROID, LEVOTHROID) 175 MCG TABLET    Take 175 mcg by mouth daily.     METFORMIN (GLUCOPHAGE) 500 MG TABLET    Take 500 mg by mouth 2 (two) times daily with a meal. One in the am and two in pm   NAPROXEN (EC NAPROSYN) 500 MG EC TABLET    Take 1 tablet (500 mg total) by mouth 2 (two) times daily as needed.   ROSUVASTATIN (CRESTOR) 10 MG TABLET    Take 10 mg by mouth 2 (two) times a week. Mondays and Thursdays    TOLTERODINE (DETROL LA) 4 MG 24 HR CAPSULE    Take 4 mg by mouth daily.   TOPIRAMATE (TOPAMAX) 25 MG CAPSULE     Take 50 mg by mouth 2 (two) times daily.     VERAPAMIL (CALAN-SR) 240 MG CR TABLET    Take 480 mg by mouth at bedtime.    Modified Medications   No medications on file  Discontinued Medications   [employer] FIBER GUMMIES 2.5 G CHEW    Chew 1 capsule by mouth daily.     DICLOFENAC (VOLTAREN) 75 MG EC TABLET    Take 1 tablet (75 mg total) by mouth 2 (two) times daily.

## 2015-11-09 ENCOUNTER — Ambulatory Visit (INDEPENDENT_AMBULATORY_CARE_PROVIDER_SITE_OTHER): Payer: Medicaid Other | Admitting: Primary Care

## 2015-11-09 ENCOUNTER — Encounter: Payer: Self-pay | Admitting: Primary Care

## 2015-11-09 VITALS — BP 114/80 | HR 74 | Temp 97.5°F | Ht 67.5 in | Wt 270.0 lb

## 2015-11-09 DIAGNOSIS — R062 Wheezing: Secondary | ICD-10-CM | POA: Diagnosis not present

## 2015-11-09 DIAGNOSIS — R05 Cough: Secondary | ICD-10-CM | POA: Diagnosis not present

## 2015-11-09 DIAGNOSIS — R059 Cough, unspecified: Secondary | ICD-10-CM

## 2015-11-09 MED ORDER — AZITHROMYCIN 250 MG PO TABS: ORAL_TABLET | ORAL | Status: DC

## 2015-11-09 MED ORDER — ALBUTEROL SULFATE HFA 108 (90 BASE) MCG/ACT IN AERS: 2.0000 | INHALATION_SPRAY | Freq: Four times a day (QID) | RESPIRATORY_TRACT | Status: DC | PRN

## 2015-11-09 MED ORDER — AEROCHAMBER PLUS FLO-VU MEDIUM MISC: Status: DC

## 2015-11-09 NOTE — Progress Notes (Signed)
Pre visit review using our clinic review tool, if applicable. No additional management support is needed unless otherwise documented below in the visit note. 

## 2015-11-09 NOTE — Progress Notes (Signed)
Subjective:    Patient ID: Christine Koch, female    DOB: [birth year], 40 y.o.   MRN: [id number]  HPI  Christine Koch is a 40 year old female who presents today with a chief complaint of cough. She also reports chest congestion. Her symptoms have been present for the past 3-4 days. Her cough is productive with light yellow sputum. She's been taking ibuprofen every 4 to 6 hours for fevers. Her temperature as been low grade. Her mother has noticed moderate wheezing, no prior history of asthma. She had her flu vaccination this season.   Review of Systems  Constitutional: Positive for fever. Negative for fatigue.  HENT: Positive for congestion, postnasal drip and sore throat. Negative for ear pain.   Respiratory: Positive for cough and shortness of breath.   Cardiovascular: Negative for chest pain.  Musculoskeletal: Positive for myalgias.       Past Medical History  Diagnosis Date  . H/O: pneumonia   . Unspecified cerebral artery occlusion with cerebral infarction   . Impacted cerumen   . Internal hemorrhoids without mention of complication   . 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 Dermatophytosis of nail   . Other acute otitis externa     Social History   Social History  . Marital Status: Single    Spouse Name: N/A  . Number of Children: N/A  . Years of Education: N/A   Occupational History  . Not on file.   Social History Main Topics  . Smoking status: Never Smoker   . Smokeless tobacco: Never Used  . Alcohol Use: 0.0 oz/week    0 Standard drinks or equivalent per week     Comment: very rare  . Drug Use: No  . Sexual Activity: No   Other Topics Concern  . Not on file   Social History Narrative    Past Surgical History  Procedure Laterality Date  . 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 Neuroendoscopic placement / replacement ventricular catheter w/ attachment shunt / external drain  1995    for hydrocephaly  .  Myringotomy  1979  . Adenoidectomy  1979  . Tonsillectomy  1993    Family History  Problem Relation Age of Onset  . Diabetes Mother   . Hypertension Mother   . Diabetes Maternal Aunt   . Diabetes Maternal Grandmother   . Hypertension Maternal Grandmother   . Heart disease Maternal Grandmother   . Cancer Maternal Grandfather   . Diabetes Paternal Grandmother     Allergies  Allergen Reactions  . Codeine     REACTION: Vomiting    Current Outpatient Prescriptions on File Prior to Visit  Medication Sig Dispense Refill  . cabergoline (DOSTINEX) 0.5 MG tablet Take 0.25 mg by mouth 2 (two) times a week. Mondays and Thursdays     . cholecalciferol (VITAMIN D) 1000 UNITS tablet Take 1,000 Units by mouth daily.    Marland Kitchen docusate sodium (COLACE) 100 MG capsule Take 100 mg by mouth daily.      . DULoxetine (CYMBALTA) 30 MG capsule Take 30 mg by mouth daily.      Marland Kitchen levothyroxine (SYNTHROID, LEVOTHROID) 175 MCG tablet Take 175 mcg by mouth daily.      . metFORMIN (GLUCOPHAGE) 500 MG tablet Take 500 mg by mouth 2 (two) times daily with a meal. One in the am and two in pm    . rosuvastatin (CRESTOR) 10 MG tablet  Take 10 mg by mouth 2 (two) times a week. Mondays and Thursdays     . tolterodine (DETROL LA) 4 MG 24 hr capsule Take 4 mg by mouth daily.    Marland Kitchen topiramate (TOPAMAX) 25 MG capsule Take 50 mg by mouth 2 (two) times daily.      . verapamil (CALAN-SR) 240 MG CR tablet Take 480 mg by mouth at bedtime.      . naproxen (EC NAPROSYN) 500 MG EC tablet Take 1 tablet (500 mg total) by mouth 2 (two) times daily as needed. (Patient not taking: Reported on 11/03/2015) 45 tablet 0   No current facility-administered medications on file prior to visit.    BP 114/80 mmHg  Pulse 74  Temp(Src) 97.5 F (36.4 C) (Oral)  Ht 5' 7.5" (1.715 m)  Wt 270 lb (122.471 kg)  BMI 41.64 kg/m2  SpO2 95%  LMP 11/05/2015    Objective:   Physical Exam  Constitutional: She appears well-nourished. She appears ill.    HENT:  Right Ear: Tympanic membrane and ear canal normal.  Left Ear: Tympanic membrane and ear canal normal.  Nose: Right sinus exhibits no maxillary sinus tenderness and no frontal sinus tenderness. Left sinus exhibits no maxillary sinus tenderness and no frontal sinus tenderness.  Mouth/Throat: Oropharynx is clear and moist.  Eyes: Conjunctivae are normal.  Neck: Neck supple.  Cardiovascular: Normal rate and regular rhythm.   Pulmonary/Chest: Effort normal. She has no decreased breath sounds. She has wheezes in the right upper field and the left upper field. She has rhonchi in the right upper field, the right lower field, the left upper field and the left lower field.  Skin: Skin is warm and dry.          Assessment & Plan:  URI:  Cough, congestion, low grade fevers, wheezing x 3 days. Exam with moderate rhonchi to bases with mild wheezing to apices. No history of asthma. Due to examination and presentation will treat for bacterial involvement as suspicion for viral involvement, especially for influenza is low.  RX for Zpak and albuterol inhaler PRN for wheezing provided. Spacer ordered but is too expensive, will hold off.  Fluids, rest, Mucinex or Robitussin PRN. Return precautions provided.

## 2015-11-09 NOTE — Patient Instructions (Addendum)
Start Azithromycin antibiotics. Take 2 tablets by mouth today, then 1 tablet daily for 4 additional days.  You may use the albuterol inhaler with the spacer every 6 hours as needed for wheezing or shortness of breath.  Cough/Congestion: Try taking Mucinex DM. This will help loosen up the mucous in your chest. Ensure you take this medication with a full glass of water.  Continue ibuprofen as needed for fevers and body aches.   Increase consumption of water and rest.  It was a pleasure to see you today!

## 2015-12-01 ENCOUNTER — Encounter: Payer: Self-pay | Admitting: Physical Therapy

## 2015-12-01 ENCOUNTER — Ambulatory Visit: Payer: Medicaid Other | Attending: Internal Medicine | Admitting: Physical Therapy

## 2015-12-01 DIAGNOSIS — R262 Difficulty in walking, not elsewhere classified: Secondary | ICD-10-CM

## 2015-12-01 DIAGNOSIS — M25561 Pain in right knee: Secondary | ICD-10-CM | POA: Insufficient documentation

## 2015-12-01 NOTE — Therapy (Signed)
[place] [facility] SERVICES [address] [place], [place], [address] Phone: [phone]   Fax:  [phone]  Physical Therapy Evaluation  Patient Details  Name: Christine Koch MRN: [id number] Date of Birth: 04-21-1976 Referring Provider: Crist Infante  Encounter Date: 12/01/2015      PT End of Session - 12/01/15 1135    Visit Number 1   Number of Visits 24   Date for PT Re-Evaluation 02/24/16   Authorization Type medicaid   PT Start Time 1045   PT Stop Time 1115   PT Time Calculation (min) 30 min   Activity Tolerance Patient limited by pain   Behavior During Therapy [facility] for tasks assessed/performed      Past Medical History  Diagnosis Date  . H/O: pneumonia   . Unspecified cerebral artery occlusion with cerebral infarction   . Impacted cerumen   . Internal hemorrhoids without mention of complication   . 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 Dermatophytosis of nail   . Other acute otitis externa     Past Surgical History  Procedure Laterality Date  . 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 Neuroendoscopic placement / replacement ventricular catheter w/ attachment shunt / external drain  1995    for hydrocephaly  . Myringotomy  1979  . Adenoidectomy  1979  . Tonsillectomy  1993    There were no vitals filed for this visit.  Visit Diagnosis:  Difficulty in walking, not elsewhere classified  Pain in right knee      Subjective Assessment - 12/01/15 1059    Subjective Patient reports pain and stiffness in right knee. 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 She has cognitive impairements   Limitations Standing;Walking;House hold activities   Diagnostic tests MRI   Patient Stated Goals to be able to walk and go shopping in the  community   Currently in Pain? No/denies  She reports pain with movement 10/10      PAIN: 0/10 without movement and 10/10 with movement   POSTURE: lateral trunk lean, right knee flex and right ankle tone with gait    PROM/AROM: 0-120 deg  STRENGTH:  Graded on a 0-5 scale Muscle Group Left Right  Shoulder flex    Shoulder Abd    Shoulder Ext    Shoulder IR/ER    Elbow    Wrist/hand    Hip Flex 4 3  Hip Abd    Hip Add    Hip Ext    Hip IR/ER    Knee Flex 5 3  Knee Ext 5 3  Ankle DF 5 2  Ankle PF 5 2       FUNCTIONAL MOBILITY: independent,  guarded and slow, sit to stand transfer painful and slow   BALANCE: unable to tandem stand or single leg stand due to pain in RLE knee   GAIT: Antalgic gait without AD with decreased gait speed  OUTCOME MEASURES: TEST Outcome Interpretation  5 times sit<>stand 41.51sec >60 yo, >15 sec indicates increased risk for falls  10 meter walk test  .35               m/s <1.0 m/s indicates increased risk for falls; limited community ambulator  Timed up and Go  24.25            sec <14 sec indicates increased risk for falls                                   [facility] PT Assessment - 12/01/15 0001    Assessment   Medical Diagnosis Patellar tendonitis    Referring Provider PERINI, MARK   Onset Date/Surgical Date 11/05/15   Prior Therapy none   Precautions   Precautions Fall   Restrictions   Weight Bearing Restrictions No   Balance Screen   Has the patient fallen in the past 6 months Yes   How many times? several   Has the patient had a decrease in activity level because of a fear of falling?  Yes   Is the patient reluctant to leave their home because of a fear of falling?  No   Home Social worker Private residence   Living Arrangements Parent   Available Help at Discharge Family   Type of [place] None   Prior Function   Level of Independence Independent with basic ADLs   Vocation  On disability                           PT Education - 12/01/15 1101    Education provided Yes   Education Details plan of care   Person(s) Educated Patient   Methods Explanation   Comprehension Verbalized understanding             PT Long Term Goals - 12/01/15 1141    PT LONG TERM GOAL #1   Title Patient will reduce timed up and go to <11 seconds to reduce fall risk and demonstrate improved transfer/gait ability   Baseline TUG is 24.25 sec   Time 12   Period Weeks   Status New   PT LONG TERM GOAL #2   Title Patient will increase 10 meter walk test to >1.85m/s as to improve gait speed for better community ambulation and to reduce fall risk   Baseline . 35 m/sec 10 MW speed   Time 12   Period Weeks   Status New   PT LONG TERM GOAL #3   Title  Patient (< 59 years old) will complete five times sit to stand test in < 10 seconds indicating an increased LE strength and improved balance   Baseline 41.57 seconds for 5 x sit to stand   Time 12   Period Weeks   Status New   PT LONG TERM GOAL #4   Title Patient will report a worst pain of 3/10 on VAS in right knee  to improve tolerance with ADLs and reduced symptoms with activities.    Baseline 10/10 pain with movement   Time 12   Period Weeks   Status New               Plan - 12/01/15 1101    Clinical Impression Statement Patient has pain in her right knee for about one year, she has frequent falls and difficuty walking greater than household distances. She is having pain in right knee with all mobiity.     Pt will benefit from skilled therapeutic intervention in order to improve on the following deficits Abnormal gait;Pain;Obesity;Difficulty walking;Decreased activity tolerance;Decreased range of motion;Decreased strength   Rehab Potential Good   PT  Frequency 2x / week   PT Duration 12 weeks   PT Treatment/Interventions Cryotherapy;Electrical Stimulation;Therapeutic exercise;Gait  training;Iontophoresis 4mg /ml Dexamethasone   PT Next Visit Plan ionto phoresis and strengthening   Consulted and Agree with Plan of Care Patient;Family member/caregiver          G-Codes - 2015-12-24 1146    Functional Assessment Tool Used 5 x sit to stand , TUG , 10 MW   Functional Limitation Mobility: Walking and moving around   Mobility: Walking and Moving Around Current Status [phone]) At least 80 percent but less than 100 percent impaired, limited or restricted   Mobility: Walking and Moving Around Goal Status [phone]) At least 60 percent but less than 80 percent impaired, limited or restricted       Problem List Patient Active Problem List   Diagnosis Date Noted  . Nocturia 10/30/2012  . Urgency incontinence 10/30/2012  . 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 Migraine 08/10/2011  . HEMORRHOIDS, INTERNAL 06/15/2010  . ONYCHOMYCOSIS, TOENAILS 05/07/2007  . 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 CEREBROVASCULAR ACCIDENT 11/22/2006  . PNEUMONIA, HX OF 11/22/2006   Alanson Puls, PT, DPT [place] [facility] 12/24/2015, 11:47 AM  [place] [facility] SERVICES [address] [place], [place], [address] Phone: [phone]   Fax:  [phone]  Name: Christine Koch MRN: [id number] Date of Birth: 29-Oct-1975

## 2016-05-10 ENCOUNTER — Ambulatory Visit (INDEPENDENT_AMBULATORY_CARE_PROVIDER_SITE_OTHER): Payer: Medicaid Other

## 2016-05-10 DIAGNOSIS — Z23 Encounter for immunization: Secondary | ICD-10-CM | POA: Diagnosis not present

## 2016-06-01 ENCOUNTER — Other Ambulatory Visit: Payer: Self-pay | Admitting: Endocrinology

## 2016-06-01 DIAGNOSIS — D352 Benign neoplasm of pituitary gland: Secondary | ICD-10-CM

## 2016-06-17 ENCOUNTER — Ambulatory Visit
Admission: RE | Admit: 2016-06-17 | Discharge: 2016-06-17 | Disposition: A | Discharge status: Home / Self Care | Payer: Medicaid Other | Source: Ambulatory Visit | Attending: Endocrinology | Admitting: Endocrinology

## 2016-06-17 DIAGNOSIS — D352 Benign neoplasm of pituitary gland: Secondary | ICD-10-CM

## 2016-06-17 MED ORDER — GADOBENATE DIMEGLUMINE 529 MG/ML IV SOLN
10.0000 mL | Freq: Once | INTRAVENOUS | Status: AC | PRN
  Administered 2016-06-17: 10 mL via INTRAVENOUS

## 2016-08-08 ENCOUNTER — Encounter: Payer: Medicaid Other | Admitting: Gynecology

## 2016-09-15 ENCOUNTER — Ambulatory Visit (INDEPENDENT_AMBULATORY_CARE_PROVIDER_SITE_OTHER): Payer: Medicaid Other | Admitting: Gynecology

## 2016-09-15 ENCOUNTER — Encounter: Payer: Self-pay | Admitting: Gynecology

## 2016-09-15 VITALS — BP 124/80 | Ht 67.0 in | Wt 271.0 lb

## 2016-09-15 DIAGNOSIS — Z0000 Encounter for general adult medical examination without abnormal findings: Secondary | ICD-10-CM

## 2016-09-15 DIAGNOSIS — Z01419 Encounter for gynecological examination (general) (routine) without abnormal findings: Secondary | ICD-10-CM | POA: Diagnosis not present

## 2016-09-15 NOTE — Progress Notes (Signed)
Christine Koch 1976/08/01 [id number]   History:    41 y.o.  for annual gyn exam who is asymptomatic today.Patient reports normal menstrual cycles. 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 Reynold Bowen who has been treating her as well for hypothyroidism as well as hyper prolactinemia and hypercholesterolemia, and vitamin D deficiency. Patient has suffered from overactive bladder and is now on oral anticholinergic. See medication list as well as past medical history for additional details. Patient with no prior history of abnormal Pap smears.   Past medical history,surgical history, family history and social history were all reviewed and documented in the EPIC chart.  Gynecologic History Patient's last menstrual period was 09/08/2016. Contraception: none Last Pap: 2014. Results were: normal Last mammogram: Not indicated. Results were: Not indicated  Obstetric History OB History  [place] Para Term Preterm AB Living  0 0 0 0 0 0  SAB TAB Ectopic Multiple Live Births  0 0 0 0 0         ROS: A ROS was performed and pertinent positives and negatives are included in the history.  GENERAL: No fevers or chills. HEENT: No change in vision, no earache, sore throat or sinus congestion. NECK: No pain or stiffness. CARDIOVASCULAR: No chest pain or pressure. No palpitations. PULMONARY: No shortness of breath, cough or wheeze. GASTROINTESTINAL: No abdominal pain, nausea, vomiting or diarrhea, melena or bright red blood per rectum. GENITOURINARY: No urinary frequency, urgency, hesitancy or dysuria. MUSCULOSKELETAL: No joint or muscle pain, no back pain, no recent trauma. DERMATOLOGIC: No rash, no itching, no lesions. ENDOCRINE: No polyuria, polydipsia, no heat or cold intolerance. No recent change in weight. HEMATOLOGICAL: No anemia or easy  bruising or bleeding. NEUROLOGIC: No headache, seizures, numbness, tingling or weakness. PSYCHIATRIC: No depression, no loss of interest in normal activity or change in sleep pattern.     Exam: chaperone present  Ht 5\' 7"  (1.702 m)   Wt 271 lb (122.9 kg)   LMP 09/08/2016   BMI 42.44 kg/m   Body mass index is 42.44 kg/m.  General appearance : Well developed well nourished female. No acute distress HEENT: Eyes: no retinal hemorrhage or exudates,  Neck supple, trachea midline, no carotid bruits, no thyroidmegaly Lungs: Clear to auscultation, no rhonchi or wheezes, or rib retractions  Heart: Regular rate and rhythm, no murmurs or gallops Breast:Examined in sitting and supine position were symmetrical in appearance, no palpable masses or tenderness,  no skin retraction, no nipple inversion, no nipple discharge, no skin discoloration, no axillary or supraclavicular lymphadenopathy Abdomen: no palpable masses or tenderness, no rebound or guarding Extremities: no edema or skin discoloration or tenderness  Pelvic:  Bartholin, Urethra, Skene Glands: Within normal limits             Vagina: No gross lesions or discharge  Cervix: No gross lesions or discharge  Uterus  anteverted, normal size, shape and consistency, non-tender and mobile  Adnexa  Without masses or tenderness  Anus and perineum  normal   Rectovaginal  normal sphincter tone without palpated masses or tenderness             Hemoccult not indicated     Assessment/Plan:  41 y.o. female for annual exam who is doing well. She was reminded once again the importance of monthly breast exam. Her PCP and endocrinologist have been doing  her lab work and her vaccines are up-to-date. Pap smear was done today.   Terrance Mass MD, 11:07 AM 09/15/2016

## 2016-09-15 NOTE — Addendum Note (Signed)
Addended by: Burnett Kanaris on: 09/15/2016 12:33 PM   Modules accepted: Orders

## 2016-09-18 LAB — PAP IG W/ RFLX HPV ASCU

## 2016-11-29 ENCOUNTER — Other Ambulatory Visit: Payer: Self-pay | Admitting: Primary Care

## 2016-11-29 DIAGNOSIS — R062 Wheezing: Secondary | ICD-10-CM

## 2016-11-29 NOTE — Telephone Encounter (Signed)
Ok to refill? Electronically refill request for albuterol (PROVENTIL HFA;VENTOLIN HFA) 108 (90 Base) MCG/ACT inhaler. Last prescribed on 11/09/2015.   It stated that PCP is Reynold Bowen. Patient last CPE was 08/10/2011 with Dr Diona Browner. Patient saw Dr Lorelei Pont a few times in 2016 and 2017. Patient saw Anda Kraft on 11/09/2015 for wheezing/cough.

## 2016-11-29 NOTE — Telephone Encounter (Signed)
If she is not a patient with our practice then she needs to contact her own PCP for refills. Thanks.

## 2017-01-10 ENCOUNTER — Encounter: Payer: Self-pay | Admitting: Gynecology

## 2017-02-14 ENCOUNTER — Encounter: Payer: Self-pay | Admitting: Gastroenterology

## 2017-03-07 ENCOUNTER — Encounter: Payer: Self-pay | Admitting: Gastroenterology

## 2017-03-07 ENCOUNTER — Other Ambulatory Visit (INDEPENDENT_AMBULATORY_CARE_PROVIDER_SITE_OTHER): Payer: Medicaid Other

## 2017-03-07 ENCOUNTER — Ambulatory Visit (INDEPENDENT_AMBULATORY_CARE_PROVIDER_SITE_OTHER): Payer: Medicaid Other | Admitting: Gastroenterology

## 2017-03-07 ENCOUNTER — Encounter (INDEPENDENT_AMBULATORY_CARE_PROVIDER_SITE_OTHER): Payer: Self-pay

## 2017-03-07 VITALS — BP 100/62 | HR 64 | Ht 67.5 in | Wt 270.0 lb

## 2017-03-07 DIAGNOSIS — K5900 Constipation, unspecified: Secondary | ICD-10-CM

## 2017-03-07 DIAGNOSIS — K649 Unspecified hemorrhoids: Secondary | ICD-10-CM | POA: Diagnosis not present

## 2017-03-07 LAB — CBC WITH DIFFERENTIAL/PLATELET
Basophils Absolute: 0 10*3/uL (ref 0.0–0.1)
Basophils Relative: 0.5 % (ref 0.0–3.0)
EOS PCT: 3.2 % (ref 0.0–5.0)
Eosinophils Absolute: 0.3 10*3/uL (ref 0.0–0.7)
HCT: 34.8 % — ABNORMAL LOW (ref 36.0–46.0)
Hemoglobin: 11.8 g/dL — ABNORMAL LOW (ref 12.0–15.0)
LYMPHS PCT: 31.8 % (ref 12.0–46.0)
Lymphs Abs: 3.2 10*3/uL (ref 0.7–4.0)
MCHC: 34 g/dL (ref 30.0–36.0)
MCV: 90.7 fl (ref 78.0–100.0)
MONOS PCT: 7.4 % (ref 3.0–12.0)
Monocytes Absolute: 0.7 10*3/uL (ref 0.1–1.0)
Neutro Abs: 5.7 10*3/uL (ref 1.4–7.7)
Neutrophils Relative %: 57.1 % (ref 43.0–77.0)
Platelets: 310 10*3/uL (ref 150.0–400.0)
RBC: 3.83 Mil/uL — AB (ref 3.87–5.11)
RDW: 13.1 % (ref 11.5–15.5)
WBC: 10 10*3/uL (ref 4.0–10.5)

## 2017-03-07 LAB — COMPREHENSIVE METABOLIC PANEL
ALBUMIN: 4.1 g/dL (ref 3.5–5.2)
ALK PHOS: 88 U/L (ref 39–117)
ALT: 43 U/L — ABNORMAL HIGH (ref 0–35)
AST: 37 U/L (ref 0–37)
BILIRUBIN TOTAL: 0.3 mg/dL (ref 0.2–1.2)
BUN: 16 mg/dL (ref 6–23)
CO2: 26 mEq/L (ref 19–32)
Calcium: 9.3 mg/dL (ref 8.4–10.5)
Chloride: 107 mEq/L (ref 96–112)
Creatinine, Ser: 0.82 mg/dL (ref 0.40–1.20)
GFR: 82.25 mL/min (ref >60.00)
Glucose, Bld: 156 mg/dL — ABNORMAL HIGH (ref 70–99)
POTASSIUM: 3.7 meq/L (ref 3.5–5.1)
Sodium: 139 mEq/L (ref 135–145)
TOTAL PROTEIN: 7.2 g/dL (ref 6.0–8.3)

## 2017-03-07 MED ORDER — NA SULFATE-K SULFATE-MG SULF 17.5-3.13-1.6 GM/177ML PO SOLN: 1.0000 | Freq: Once | ORAL | 0 refills | Status: AC

## 2017-03-07 NOTE — Patient Instructions (Addendum)
Please start taking citrucel (orange flavored) powder fiber supplement.  This may cause some bloating at first but that usually goes away. Begin with a small spoonful and work your way up to a large, heaping spoonful daily over a week.  Stay hydrated.  You will have labs checked today in the basement lab.  Please head down after you check out with the front desk  (cbc, cmet).  You will be set up for a colonoscopy for bleeding.  Restart preparation H twice daily for now.  Continue once daily linzess.  Normal BMI (Body Mass Index- based on height and weight) is between 19 and 25. Your BMI today is Body mass index is 41.66 kg/m. Marland Kitchen Please consider follow up  regarding your BMI with your Primary Care Provider.

## 2017-03-07 NOTE — Progress Notes (Signed)
HPI: This is a very pleasant 41 year old woman  who was referred to me by Reynold Bowen, MD  to evaluate  rectal bleeding, constipation .    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 Due to that she has significant memory issues and word finding difficulties. Her mother is with her today and she helps with a lot of history.  Christine Koch has had long-term constipation issues. I have a bowel movement only with a lot of straining. She recently started Linz at 290 g per dayess With only moderate success. She still seems to have to push and strain quite a bit. She does stay hydrated. She noticed red rectal bleeding which started about a month ago. This is mixed with otherwise normal-appearing solid bowel movements. Her mom says it is not bloody diarrhea. Previously she was found to have some external hemorrhoids by her primary care physician and intermittently her mom will help her with preparation H. She has not done this in many months.  She is otherwise feeling well without abdominal pains, no rectal or anal pains. Her weight has been stable.     Review of systems: Pertinent positive and negative review of systems were noted in the above HPI section. All other review negative.   Past Medical History:  Diagnosis Date  . 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 Depression   . Dermatophytosis of nail   . Diabetes ([place])   . H/O: pneumonia   . Headache   . Impacted cerumen   . Internal hemorrhoids without mention of complication   . Other acute otitis externa   . Thyroid disease   . Unspecified cerebral artery occlusion with cerebral infarction 1995    Past Surgical History:  Procedure Laterality Date  . ADENOIDECTOMY  1979  . MYRINGOTOMY  1979  . NEUROENDOSCOPIC PLACEMENT / REPLACEMENT VENTRICULAR CATHETER W/ ATTACHMENT SHUNT /  EXTERNAL DRAIN  1995   for hydrocephaly  . TONSILLECTOMY  1993  . TUMOR REMOVAL  1995   Pituitary tumor    Current Outpatient Prescriptions  Medication Sig Dispense Refill  . cabergoline (DOSTINEX) 0.5 MG tablet Take 0.25 mg by mouth 2 (two) times a week. Mondays and Thursdays     . DULoxetine (CYMBALTA) 30 MG capsule Take 30 mg by mouth daily.      Marland Kitchen levothyroxine (SYNTHROID, LEVOTHROID) 175 MCG tablet Take 175 mcg by mouth daily.      Marland Kitchen linaclotide (LINZESS) 290 MCG CAPS capsule Take 290 mcg by mouth daily before breakfast.    . metFORMIN (GLUCOPHAGE) 500 MG tablet Take 1,000 mg by mouth 2 (two) times daily with a meal. One in the am and two in pm     . rosuvastatin (CRESTOR) 10 MG tablet Take 10 mg by mouth 2 (two) times a week. Mondays and Thursdays     . tolterodine (DETROL LA) 4 MG 24 hr capsule Take 4 mg by mouth daily.    Marland Kitchen topiramate (TOPAMAX) 25 MG capsule Take 50 mg by mouth 2 (two) times daily.      . verapamil (CALAN-SR) 240 MG CR tablet Take 480 mg by mouth at bedtime.      . Vitamin D, Ergocalciferol, (DRISDOL) 50000 units CAPS capsule Take 5,000 Units by mouth once a week.  6   No current facility-administered medications for this visit.  Allergies as of 03/07/2017 - Review Complete 03/07/2017  Allergen Reaction Noted  . Codeine      Family History  Problem Relation Age of Onset  . Diabetes Mother   . Hypertension Mother   . Colon polyps Mother   . Diabetes Maternal Aunt   . Diabetes Maternal Grandmother   . Hypertension Maternal Grandmother   . Heart disease Maternal Grandmother   . Cancer Maternal Grandfather        unknown type? possibly lung cancer  . Diabetes Paternal Grandmother     Social History   Social History  . Marital status: Single    Spouse name: N/A  . Number of children: N/A  . Years of education: N/A   Occupational History  . Not on file.   Social History Main Topics  . Smoking status: Never Smoker  . Smokeless tobacco: Never  Used  . Alcohol use 0.0 oz/week     Comment: very rare  . Drug use: No  . Sexual activity: No   Other Topics Concern  . Not on file   Social History Narrative  . No narrative on file     Physical Exam: BP 100/62   Pulse 64   Ht 5' 7.5" (1.715 m)   Wt 270 lb (122.5 kg)   BMI 41.66 kg/m  Constitutional: generally well-appearing Psychiatric: alert and oriented x3 Eyes: extraocular movements intact Mouth: oral pharynx moist, no lesions Neck: supple no lymphadenopathy Cardiovascular: heart regular rate and rhythm Lungs: clear to auscultation bilaterally Abdomen: soft, nontender, nondistended, no obvious ascites, no peritoneal signs, normal bowel sounds Extremities: no lower extremity edema bilaterally Skin: no lesions on visible extremities Rectal examination with female assistant in the room : There were 2 or 3 deflated appearing small to medium sized external hemorrhoids, no anal fissures, distal rectal exam suggests smooth internal hemorrhoids, no distal rectal masses. Stool was brown and not checked for Hemoccult.   Assessment and plan: 41 y.o. female with  Constipation, hemorrhoids, rectal bleeding  I think her bleeding is likely related to the hemorrhoids. She is going to continue on her daily Linzess and will add daily fiber supplements as well. I'm hoping that the better we can get her constipation, pushing and straining under control the less problems she will have with her hemorrhoids. I recommended colonoscopy to exclude other potential causes such as rectal masses, colitis and I explained to her and her mother that I think that is unlikely. She will get a CBC and complete metabolic profile today.   If she is still having difficulties with what turns out to be only hemorrhoidal bleeding after colonoscopy, despite constipation under better control but I will likely refer her to one of my partners for hemorrhoidal banding consideration.  lease see the "Patient  Instructions" section for addition details about the plan.   Owens Loffler, MD [facility] 03/07/2017, 10:31 AM  Cc: Reynold Bowen, MD

## 2017-03-12 ENCOUNTER — Ambulatory Visit (AMBULATORY_SURGERY_CENTER): Payer: Medicaid Other | Admitting: Gastroenterology

## 2017-03-12 ENCOUNTER — Encounter: Payer: Self-pay | Admitting: Gastroenterology

## 2017-03-12 VITALS — BP 131/69 | HR 50 | Temp 95.7°F | Resp 12 | Ht 67.0 in | Wt 270.0 lb

## 2017-03-12 DIAGNOSIS — K5900 Constipation, unspecified: Secondary | ICD-10-CM

## 2017-03-12 DIAGNOSIS — K649 Unspecified hemorrhoids: Secondary | ICD-10-CM

## 2017-03-12 DIAGNOSIS — K921 Melena: Secondary | ICD-10-CM

## 2017-03-12 MED ORDER — SODIUM CHLORIDE 0.9 % IV SOLN: 500.0000 mL | INTRAVENOUS | Status: AC

## 2017-03-12 NOTE — Patient Instructions (Signed)
Impression/Recommendations:  Hemorrhoid handout given to patient.  Continue present medications. Begin fiber supplement. Call Dr. Ardis Hughs office in 4-5 weeks to report how you are doing.  YOU HAD AN ENDOSCOPIC PROCEDURE TODAY AT [facility]:   Refer to the procedure report that was given to you for any specific questions about what was found during the examination.  If the procedure report does not answer your questions, please call your gastroenterologist to clarify.  If you requested that your care partner not be given the details of your procedure findings, then the procedure report has been included in a sealed envelope for you to review at your convenience later.  YOU SHOULD EXPECT: Some feelings of bloating in the abdomen. Passage of more gas than usual.  Walking can help get rid of the air that was put into your GI tract during the procedure and reduce the bloating. If you had a lower endoscopy (such as a colonoscopy or flexible sigmoidoscopy) you may notice spotting of blood in your stool or on the toilet paper. If you underwent a bowel prep for your procedure, you may not have a normal bowel movement for a few days.  Please Note:  You might notice some irritation and congestion in your nose or some drainage.  This is from the oxygen used during your procedure.  There is no need for concern and it should clear up in a day or so.  SYMPTOMS TO REPORT IMMEDIATELY:   Following lower endoscopy (colonoscopy or flexible sigmoidoscopy):  Excessive amounts of blood in the stool  Significant tenderness or worsening of abdominal pains  Swelling of the abdomen that is new, acute  Fever of 100F or higher For urgent or emergent issues, a gastroenterologist can be reached at any hour by calling [phone].   DIET:  We do recommend a small meal at first, but then you may proceed to your regular diet.  Drink plenty of fluids but you should avoid alcoholic beverages for 24  hours.  ACTIVITY:  You should plan to take it easy for the rest of today and you should NOT DRIVE or use heavy machinery until tomorrow (because of the sedation medicines used during the test).    FOLLOW UP: Our staff will call the number listed on your records the next business day following your procedure to check on you and address any questions or concerns that you may have regarding the information given to you following your procedure. If we do not reach you, we will leave a message.  However, if you are feeling well and you are not experiencing any problems, there is no need to return our call.  We will assume that you have returned to your regular daily activities without incident.  If any biopsies were taken you will be contacted by phone or by letter within the next 1-3 weeks.  Please call us at [phone] if you have not heard about the biopsies in 3 weeks.    SIGNATURES/CONFIDENTIALITY: You and/or your care partner have signed paperwork which will be entered into your electronic medical record.  These signatures attest to the fact that that the information above on your After Visit Summary has been reviewed and is understood.  Full responsibility of the confidentiality of this discharge information lies with you and/or your care-partner.

## 2017-03-12 NOTE — Progress Notes (Signed)
Pt's states no medical or surgical changes since previsit or office visit. 

## 2017-03-12 NOTE — Op Note (Signed)
[facility] Patient Name: Christine Koch Procedure Date: 03/12/2017 8:32 AM MRN: [id number] Endoscopist: Milus Banister , MD Age: 41 Referring MD:  Date of Birth: August 21, 1976 Gender: Female Account #: [id number] Procedure:                Colonoscopy Indications:              Rectal bleeding Medicines:                Monitored Anesthesia Care Procedure:                Pre-Anesthesia Assessment:                           - Prior to the procedure, a History and Physical                            was performed, and patient medications and                            allergies were reviewed. The patient's tolerance of                            previous anesthesia was also reviewed. The risks                            and benefits of the procedure and the sedation                            options and risks were discussed with the patient.                            All questions were answered, and informed consent                            was obtained. Prior Anticoagulants: The patient has                            taken no previous anticoagulant or antiplatelet                            agents. ASA Grade Assessment: II - A patient with                            mild systemic disease. After reviewing the risks                            and benefits, the patient was deemed in                            satisfactory condition to undergo the procedure.                           After obtaining informed consent, the colonoscope  was passed under direct vision. Throughout the                            procedure, the patient's blood pressure, pulse, and                            oxygen saturations were monitored continuously. The                            Colonoscope was introduced through the anus and                            advanced to the the cecum, identified by                            appendiceal orifice and ileocecal valve. The                        colonoscopy was performed without difficulty. The                            patient tolerated the procedure well. The quality                            of the bowel preparation was good. The ileocecal                            valve, appendiceal orifice, and rectum were                            photographed. Scope In: 8:37:25 AM Scope Out: 8:50:20 AM Scope Withdrawal Time: 0 hours 8 minutes 14 seconds  Total Procedure Duration: 0 hours 12 minutes 55 seconds  Findings:                 External and internal hemorrhoids were found. The                            hemorrhoids were medium-sized.                           The exam was otherwise without abnormality on                            direct and retroflexion views. Complications:            No immediate complications. Estimated blood loss:                            None. Estimated Blood Loss:     Estimated blood loss: none. Impression:               - External and internal hemorrhoids.                           - The examination was otherwise normal on direct  and retroflexion views.                           - No polyps or cancers. Recommendation:           - Patient has a contact number available for                            emergencies. The signs and symptoms of potential                            delayed complications were discussed with the                            patient. Return to normal activities tomorrow.                            Written discharge instructions were provided to the                            patient.                           - Resume previous diet.                           - Continue present medications. Start the once                            daily fiber supplement we spoke about in the                            office. Call my office in 4-5 weeks to report on                            your response. If still having intermittent                             bleeding despite improved constipation, will likely                            refer you to one of my partners to consider                            internal hemorrhoid banding.                           - Repeat colonoscopy at age 29 for screening                            purposes. Milus Banister, MD 03/12/2017 8:58:32 AM This report has been signed electronically.

## 2017-03-12 NOTE — Progress Notes (Signed)
A/ox3, pleased with MAC, report to RN 

## 2017-03-13 ENCOUNTER — Telehealth: Payer: Self-pay | Admitting: *Deleted

## 2017-03-13 NOTE — Telephone Encounter (Signed)
  Follow up Call-  Call back number 03/12/2017  Post procedure Call Back phone  # [phone]  Permission to leave phone message Yes  Some recent data might be hidden     Patient questions:  Do you have a fever, pain , or abdominal swelling? No. Pain Score  0 *  Have you tolerated food without any problems? Yes.    Have you been able to return to your normal activities? Yes.    Do you have any questions about your discharge instructions: Diet   No. Medications  No. Follow up visit  No.  Do you have questions or concerns about your Care? No.  Actions: * If pain score is 4 or above: No action needed, pain <4.

## 2017-07-04 ENCOUNTER — Ambulatory Visit (INDEPENDENT_AMBULATORY_CARE_PROVIDER_SITE_OTHER): Payer: Medicaid Other | Admitting: Family Medicine

## 2017-07-04 ENCOUNTER — Encounter: Payer: Self-pay | Admitting: Family Medicine

## 2017-07-04 VITALS — BP 106/62 | HR 63 | Temp 98.0°F | Wt 275.2 lb

## 2017-07-04 DIAGNOSIS — T192XXA Foreign body in vulva and vagina, initial encounter: Secondary | ICD-10-CM

## 2017-07-04 NOTE — Progress Notes (Signed)
   Subjective:    Patient ID: Christine Koch, female    DOB: 07-29-1976, 41 y.o.   MRN: [id number]  HPI This is a 41 yo female (daughter of Neomia Herbel) who presents today with tampon that she can not remove. She inserted the tampon last night and was unable to find the string this morning.   Past Medical History:  Diagnosis Date  . 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 Depression   . Dermatophytosis of nail   . Diabetes ([place])   . H/O: pneumonia   . Headache   . Impacted cerumen   . Internal hemorrhoids without mention of complication   . Other acute otitis externa   . Thyroid disease   . Unspecified cerebral artery occlusion with cerebral infarction 1995   Past Surgical History:  Procedure Laterality Date  . ADENOIDECTOMY  1979  . MYRINGOTOMY  1979  . NEUROENDOSCOPIC PLACEMENT / REPLACEMENT VENTRICULAR CATHETER W/ ATTACHMENT SHUNT / EXTERNAL DRAIN  1995   for hydrocephaly  . TONSILLECTOMY  1993  . TUMOR REMOVAL  1995   Pituitary tumor   Family History  Problem Relation Age of Onset  . Diabetes Mother   . Hypertension Mother   . Colon polyps Mother   . Diabetes Maternal Aunt   . Diabetes Maternal Grandmother   . Hypertension Maternal Grandmother   . Heart disease Maternal Grandmother   . Cancer Maternal Grandfather        unknown type? possibly lung cancer  . Diabetes Paternal Grandmother    Social History   Tobacco Use  . Smoking status: Never Smoker  . Smokeless tobacco: Never Used  Substance Use Topics  . Alcohol use: Yes    Alcohol/week: 0.0 oz    Comment: very rare  . Drug use: No      Review of Systems Per HPI    Objective:   Physical Exam  Constitutional: She is oriented to person, place, and time. She appears well-developed and well-nourished. No distress.  HENT:  Head: Normocephalic and atraumatic.  Cardiovascular: Normal rate.  Pulmonary/Chest: Effort normal.  Genitourinary:  Genitourinary  Comments: Tampon removed from vagina without difficulty. Sting visible externally.   Neurological: She is alert and oriented to person, place, and time.  Skin: Skin is warm and dry. She is not diaphoretic.  Psychiatric: She has a normal mood and affect. Her behavior is normal. Judgment and thought content normal.  Vitals reviewed.     BP 106/62 (BP Location: Right Arm, Patient Position: Sitting, Cuff Size: Large)   Pulse 63   Temp 98 F (36.7 C) (Oral)   Wt 275 lb 4 oz (124.9 kg)   LMP 07/03/2017   SpO2 98%   BMI 43.11 kg/m  Wt Readings from Last 3 Encounters:  07/04/17 275 lb 4 oz (124.9 kg)  03/12/17 270 lb (122.5 kg)  03/07/17 270 lb (122.5 kg)       Assessment & Plan:  1. Retained tampon, initial encounter - removed without difficulty - follow up PRN   Clarene Reamer, FNP-BC  [facility], [place]  07/04/2017 12:40 PM

## 2017-07-30 ENCOUNTER — Encounter: Payer: Self-pay | Admitting: Primary Care

## 2017-07-30 ENCOUNTER — Ambulatory Visit (INDEPENDENT_AMBULATORY_CARE_PROVIDER_SITE_OTHER): Payer: Medicaid Other | Admitting: Primary Care

## 2017-07-30 VITALS — BP 128/68 | HR 57 | Temp 97.6°F | Wt 273.5 lb

## 2017-07-30 DIAGNOSIS — M545 Low back pain: Secondary | ICD-10-CM

## 2017-07-30 MED ORDER — TIZANIDINE HCL 4 MG PO TABS: 4.0000 mg | ORAL_TABLET | Freq: Three times a day (TID) | ORAL | 0 refills | Status: DC | PRN

## 2017-07-30 MED ORDER — PREDNISONE 10 MG PO TABS: ORAL_TABLET | ORAL | 0 refills | Status: DC

## 2017-07-30 NOTE — Patient Instructions (Signed)
Start prednisone tablets. Take three tablets for 3 days, then two tablets for 3 days, then one tablet for 3 days.  Hold off on taking Naproxen while on prednisone. Watch your blood sugars, be sure to avoid food that will increase your sugar levels.  You may take the Tizanidine (mucle relaxer) every 8 hours as needed for muscle tightness. Caution as this may cause drowsiness. You may want to start at bedtime first.  Apply a heating pad three times daily for 20 minute intervals.  Try to get up and stretch at least every hour. This will prevent further stiffness.   It was a pleasure to see you today!   Back Exercises The following exercises strengthen the muscles that help to support the back. They also help to keep the lower back flexible. Doing these exercises can help to prevent back pain or lessen existing pain. If you have back pain or discomfort, try doing these exercises 2-3 times each day or as told by your health care provider. When the pain goes away, do them once each day, but increase the number of times that you repeat the steps for each exercise (do more repetitions). If you do not have back pain or discomfort, do these exercises once each day or as told by your health care provider. Exercises Single Knee to Chest  Repeat these steps 3-5 times for each leg: 1. Lie on your back on a firm bed or the floor with your legs extended. 2. Bring one knee to your chest. Your other leg should stay extended and in contact with the floor. 3. Hold your knee in place by grabbing your knee or thigh. 4. Pull on your knee until you feel a gentle stretch in your lower back. 5. Hold the stretch for 10-30 seconds. 6. Slowly release and straighten your leg.  Pelvic Tilt  Repeat these steps 5-10 times: 1. Lie on your back on a firm bed or the floor with your legs extended. 2. Bend your knees so they are pointing toward the ceiling and your feet are flat on the floor. 3. Tighten your lower  abdominal muscles to press your lower back against the floor. This motion will tilt your pelvis so your tailbone points up toward the ceiling instead of pointing to your feet or the floor. 4. With gentle tension and even breathing, hold this position for 5-10 seconds.  Cat-Cow  Repeat these steps until your lower back becomes more flexible: 1. Get into a hands-and-knees position on a firm surface. Keep your hands under your shoulders, and keep your knees under your hips. You may place padding under your knees for comfort. 2. Let your head hang down, and point your tailbone toward the floor so your lower back becomes rounded like the back of a cat. 3. Hold this position for 5 seconds. 4. Slowly lift your head and point your tailbone up toward the ceiling so your back forms a sagging arch like the back of a cow. 5. Hold this position for 5 seconds.  Press-Ups  Repeat these steps 5-10 times: 1. Lie on your abdomen (face-down) on the floor. 2. Place your palms near your head, about shoulder-width apart. 3. While you keep your back as relaxed as possible and keep your hips on the floor, slowly straighten your arms to raise the top half of your body and lift your shoulders. Do not use your back muscles to raise your upper torso. You may adjust the placement of your hands to make  yourself more comfortable. 4. Hold this position for 5 seconds while you keep your back relaxed. 5. Slowly return to lying flat on the floor.  Bridges  Repeat these steps 10 times: 1. Lie on your back on a firm surface. 2. Bend your knees so they are pointing toward the ceiling and your feet are flat on the floor. 3. Tighten your buttocks muscles and lift your buttocks off of the floor until your waist is at almost the same height as your knees. You should feel the muscles working in your buttocks and the back of your thighs. If you do not feel these muscles, slide your feet 1-2 inches farther away from your  buttocks. 4. Hold this position for 3-5 seconds. 5. Slowly lower your hips to the starting position, and allow your buttocks muscles to relax completely.  If this exercise is too easy, try doing it with your arms crossed over your chest. Abdominal Crunches  Repeat these steps 5-10 times: 1. Lie on your back on a firm bed or the floor with your legs extended. 2. Bend your knees so they are pointing toward the ceiling and your feet are flat on the floor. 3. Cross your arms over your chest. 4. Tip your chin slightly toward your chest without bending your neck. 5. Tighten your abdominal muscles and slowly raise your trunk (torso) high enough to lift your shoulder blades a tiny bit off of the floor. Avoid raising your torso higher than that, because it can put too much stress on your low back and it does not help to strengthen your abdominal muscles. 6. Slowly return to your starting position.  Back Lifts Repeat these steps 5-10 times: 1. Lie on your abdomen (face-down) with your arms at your sides, and rest your forehead on the floor. 2. Tighten the muscles in your legs and your buttocks. 3. Slowly lift your chest off of the floor while you keep your hips pressed to the floor. Keep the back of your head in line with the curve in your back. Your eyes should be looking at the floor. 4. Hold this position for 3-5 seconds. 5. Slowly return to your starting position.  Contact a health care provider if:  Your back pain or discomfort gets much worse when you do an exercise.  Your back pain or discomfort does not lessen within 2 hours after you exercise. If you have any of these problems, stop doing these exercises right away. Do not do them again unless your health care provider says that you can. Get help right away if:  You develop sudden, severe back pain. If this happens, stop doing the exercises right away. Do not do them again unless your health care provider says that you can. This  information is not intended to replace advice given to you by your health care provider. Make sure you discuss any questions you have with your health care provider. Document Released: 09/21/2004 Document Revised: 12/22/2015 Document Reviewed: 10/08/2014 Elsevier Interactive Patient Education  2017 [employer].

## 2017-07-30 NOTE — Progress Notes (Signed)
Subjective:    Patient ID: Christine Koch, female    DOB: Oct 22, 1975, 41 y.o.   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   She was walking in the mall yesterday for about 1.5 hours which is longer than usual. She often has to bend her neck down to the ground in order to see due to her blindness.    Her pain is located to the left lower back that has been present since yesterday morning. She had a hard time getting out of the car later that afternoon when returning from the mall. She woke up this morning and was unable to get out of the bed due to the pain. She denies radiation, injury/trauma, bowel/bladder loss, weakness, urinary, symptoms, difficulty sleeping. She's been taking OTC naproxen without any improvement.   Review of Systems  Constitutional: Negative for fever.  Genitourinary:       Denies loss of bowel/bladder control  Musculoskeletal: Positive for back pain.  Skin: Negative for color change.  Neurological: Negative for numbness.       Past Medical History:  Diagnosis Date  . 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 Depression   . Dermatophytosis of nail   . Diabetes ([place])   . H/O: pneumonia   . Headache   . Impacted cerumen   . Internal hemorrhoids without mention of complication   . Other acute otitis externa   . Thyroid disease   . Unspecified cerebral artery occlusion with cerebral infarction 1995     Social History   Socioeconomic History  . Marital status: Single    Spouse name: Not on file  . Number of children: Not on file  . Years of education: Not on file  . Highest education level: Not on file  Social Needs  . Financial resource strain: Not on file  . Food insecurity - worry: Not on file  . Food insecurity - inability: Not on file  . Transportation needs - medical: Not on  file  . Transportation needs - non-medical: Not on file  Occupational History  . Not on file  Tobacco Use  . Smoking status: Never Smoker  . Smokeless tobacco: Never Used  Substance and Sexual Activity  . Alcohol use: Yes    Alcohol/week: 0.0 oz    Comment: very rare  . Drug use: No  . Sexual activity: No  Other Topics Concern  . Not on file  Social History Narrative  . Not on file    Past Surgical History:  Procedure Laterality Date  . ADENOIDECTOMY  1979  . MYRINGOTOMY  1979  . NEUROENDOSCOPIC PLACEMENT / REPLACEMENT VENTRICULAR CATHETER W/ ATTACHMENT SHUNT / EXTERNAL DRAIN  1995   for hydrocephaly  . TONSILLECTOMY  1993  . TUMOR REMOVAL  1995   Pituitary tumor    Family History  Problem Relation Age of Onset  . Diabetes Mother   . Hypertension Mother   . Colon polyps Mother   . Diabetes Maternal Aunt   . Diabetes Maternal Grandmother   . Hypertension Maternal Grandmother   . Heart disease Maternal Grandmother   . Cancer Maternal Grandfather        unknown type? possibly lung cancer  . Diabetes Paternal Grandmother     Allergies  Allergen Reactions  . Codeine  REACTION: Vomiting    Current Outpatient Medications on File Prior to Visit  Medication Sig Dispense Refill  . cabergoline (DOSTINEX) 0.5 MG tablet Take 0.25 mg by mouth 2 (two) times a week. Mondays and Thursdays     . DULoxetine (CYMBALTA) 30 MG capsule Take 30 mg by mouth daily.      Marland Kitchen levothyroxine (SYNTHROID, LEVOTHROID) 175 MCG tablet Take 175 mcg by mouth daily.      Marland Kitchen linaclotide (LINZESS) 290 MCG CAPS capsule Take 290 mcg by mouth daily before breakfast.    . metFORMIN (GLUCOPHAGE) 500 MG tablet Take 1,000 mg by mouth 2 (two) times daily with a meal. One in the am and two in pm     . rosuvastatin (CRESTOR) 10 MG tablet Take 10 mg by mouth 2 (two) times a week. Mondays and Thursdays     . tolterodine (DETROL LA) 4 MG 24 hr capsule Take 4 mg by mouth daily.    Marland Kitchen topiramate (TOPAMAX) 25 MG  capsule Take 50 mg by mouth 2 (two) times daily.      . verapamil (CALAN-SR) 240 MG CR tablet Take 480 mg by mouth at bedtime.      . Vitamin D, Ergocalciferol, (DRISDOL) 50000 units CAPS capsule Take 5,000 Units by mouth once a week.  6   Current Facility-Administered Medications on File Prior to Visit  Medication Dose Route Frequency Provider Last Rate Last Dose  . 0.9 %  sodium chloride infusion  500 mL Intravenous Continuous Milus Banister, MD        BP 128/68   Pulse (!) 57   Temp 97.6 F (36.4 C) (Oral)   Wt 273 lb 8 oz (124.1 kg)   LMP 06/28/2017   SpO2 99%   BMI 42.84 kg/m    Objective:   Physical Exam  Constitutional: She appears well-nourished.  Neck: Neck supple.  Cardiovascular: Normal rate.  Pulmonary/Chest: Effort normal.  Abdominal: Soft. There is no CVA tenderness.  Musculoskeletal:       Lumbar back: She exhibits decreased range of motion and pain. She exhibits no tenderness and no bony tenderness.       Back:  Moderate decrease in ROM with flexion and extension. Positive straight leg raise. Negative farbers.   Skin: Skin is warm and dry. No erythema.          Assessment & Plan:  Acute Low Back Pain:  Since increased activity with ambulation 2 days ago. Exam today consistent for muscle and likely nerve involvement. Rx for low dose prednisone taper and tizanidine sent to pharmacy. Discussed to avoid NSAID's. Discussed use of heating pad and stretching exercies. She will update if no improvement in 3-4 days.  Sheral Flow, NP

## 2017-10-02 ENCOUNTER — Telehealth: Payer: Self-pay | Admitting: Gastroenterology

## 2017-10-02 NOTE — Telephone Encounter (Signed)
I reviewed colonoscopy from 2018, no need for office visit with me. She can be set up for first available hem banding appt.  Thanks

## 2017-10-02 NOTE — Telephone Encounter (Signed)
Pts mother requesting Dr. Ardis Hughs refer her daughter to a doctor in the practice that bands hemorrhoids. She has started to have more rectal bleeding. Please advise.

## 2017-10-02 NOTE — Telephone Encounter (Signed)
Spoke with patient mother. Requested female doctor out of docs that do hem bandings. Verbal approval from [facility]. Pt scheduled for banding with Dr.Nandigam for March.

## 2017-10-02 NOTE — Telephone Encounter (Signed)
Patient mother states pt has started having excess rectal bleeding. Per pt mother Dr.Jacobs states that he does not deal with hemorrhoids. Pt mother wanting to know if pt has to come in for ov to discuss hemorrhoids or if pt can be referred to another doctor in the practice to help. Pt last seen 7.16.18.

## 2017-10-02 NOTE — Telephone Encounter (Signed)
Pt may be set up with one of the GI docs that bands for hem banding, does not need OV per Dr. Ardis Hughs.

## 2017-10-15 ENCOUNTER — Telehealth: Payer: Self-pay | Admitting: Gastroenterology

## 2017-10-15 NOTE — Telephone Encounter (Signed)
Please check Hgb. Patient may need surgical removal of hemorrhoids especially if they are large prolapsed hemorrhoids

## 2017-10-15 NOTE — Telephone Encounter (Signed)
Christine Koch is the legal guardian for this patient. She reports the patient is having a lot of bleeding with bowel movements. She does not notice any dizziness or shortness of breath from the patient. But she is concerned. The appointment is moved to 10/22/17. Do you want a CBC prior to the appointment?

## 2017-10-16 ENCOUNTER — Other Ambulatory Visit (INDEPENDENT_AMBULATORY_CARE_PROVIDER_SITE_OTHER): Payer: Medicaid Other

## 2017-10-16 ENCOUNTER — Other Ambulatory Visit: Payer: Self-pay

## 2017-10-16 DIAGNOSIS — K625 Hemorrhage of anus and rectum: Secondary | ICD-10-CM

## 2017-10-16 LAB — HEMOGLOBIN: Hemoglobin: 11.5 g/dL — ABNORMAL LOW (ref 12.0–15.0)

## 2017-10-16 NOTE — Telephone Encounter (Signed)
Left information on the voicemail as requested.

## 2017-10-22 ENCOUNTER — Encounter: Payer: Self-pay | Admitting: Gastroenterology

## 2017-10-22 ENCOUNTER — Ambulatory Visit (INDEPENDENT_AMBULATORY_CARE_PROVIDER_SITE_OTHER): Payer: Medicaid Other | Admitting: Gastroenterology

## 2017-10-22 VITALS — BP 110/72 | HR 68 | Ht 70.0 in | Wt 267.0 lb

## 2017-10-22 DIAGNOSIS — K602 Anal fissure, unspecified: Secondary | ICD-10-CM

## 2017-10-22 DIAGNOSIS — K625 Hemorrhage of anus and rectum: Secondary | ICD-10-CM

## 2017-10-22 MED ORDER — AMBULATORY NON FORMULARY MEDICATION: 1 refills | Status: DC

## 2017-10-22 NOTE — Patient Instructions (Addendum)
We have given you a printed rx of Nitroglycerin to carry to [facility] 1 tablespoon three times a day   If you are age 41 or older, your body mass index should be between 23-30. Your Body mass index is 38.31 kg/m. If this is out of the aforementioned range listed, please consider follow up with your Primary Care Provider.  If you are age 69 or younger, your body mass index should be between 19-25. Your Body mass index is 38.31 kg/m. If this is out of the aformentioned range listed, please consider follow up with your Primary Care Provider.

## 2017-10-25 ENCOUNTER — Encounter: Payer: Self-pay | Admitting: Gastroenterology

## 2017-10-25 NOTE — Progress Notes (Signed)
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  She has discomfort in the rectum with almost every bowel movement and also rectal bleeding, usually small volume bright red blood.  She had external hemorrhoid lanced in the past for symptomatic thrombosed external hemorrhoid.  She is currently on Linzess 290 mcg daily with improvement of constipation.  She does not like to drink much fluids or water and does not eat high-fiber diet. Last colonoscopy March 12, 2017 with internal and external hemorrhoids otherwise exam was normal.   Outpatient Encounter Medications as of 10/22/2017  Medication Sig  . AMBULATORY NON FORMULARY MEDICATION Medication Name: 0.125% Nitroglycerin gel 1 pea sized amount per rectum three times a day  . cabergoline (DOSTINEX) 0.5 MG tablet Take 0.25 mg by mouth 2 (two) times a week. Mondays and Thursdays   . DULoxetine (CYMBALTA) 30 MG capsule Take 30 mg by mouth daily.    Marland Kitchen levothyroxine (SYNTHROID, LEVOTHROID) 175 MCG tablet Take 175 mcg by mouth daily.    Marland Kitchen linaclotide (LINZESS) 290 MCG CAPS capsule Take 290 mcg by mouth daily before breakfast.  . metFORMIN (GLUCOPHAGE) 500 MG tablet Take 1,000 mg by mouth 2 (two) times daily with a meal. One in the am and two in pm   . rosuvastatin (CRESTOR) 10 MG tablet Take 10 mg by mouth 2 (two) times a week. Mondays and Thursdays   . tolterodine (DETROL LA) 4 MG 24 hr capsule Take 4 mg by mouth daily.  Marland Kitchen topiramate (TOPAMAX) 25 MG capsule Take 50 mg by mouth 2 (two) times daily.    . verapamil (CALAN-SR) 240 MG CR tablet Take 480 mg by mouth at bedtime.    . Vitamin D, Ergocalciferol, (DRISDOL) 50000  units CAPS capsule Take 5,000 Units by mouth once a week.  . [DISCONTINUED] predniSONE (DELTASONE) 10 MG tablet Take three tablets for 3 days, then two tablets for 3 days, then one tablet for 3 days.  . [DISCONTINUED] tiZANidine (ZANAFLEX) 4 MG tablet Take 1 tablet (4 mg total) by mouth every 8 (eight) hours as needed for muscle spasms.   Facility-Administered Encounter Medications as of 10/22/2017  Medication  . 0.9 %  sodium chloride infusion    Allergies as of 10/22/2017 - Review Complete 10/22/2017  Allergen Reaction Noted  . Codeine      Past Medical History:  Diagnosis Date  . 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 Depression   . Dermatophytosis of nail   . Diabetes ([place])   . H/O: pneumonia   . Headache   . Impacted cerumen   . Internal hemorrhoids without mention of complication   . Other acute otitis externa   . Thyroid disease   . Unspecified cerebral artery occlusion with cerebral infarction 1995    Past Surgical History:  Procedure Laterality Date  . ADENOIDECTOMY  1979  . MYRINGOTOMY  1979  . NEUROENDOSCOPIC PLACEMENT / REPLACEMENT VENTRICULAR CATHETER W/ ATTACHMENT SHUNT / EXTERNAL DRAIN  1995   for hydrocephaly  . TONSILLECTOMY  1993  . TUMOR REMOVAL  1995   Pituitary tumor    Family History  Problem  Relation Age of Onset  . Diabetes Mother   . Hypertension Mother   . Colon polyps Mother   . Diabetes Maternal Aunt   . Diabetes Maternal Grandmother   . Hypertension Maternal Grandmother   . Heart disease Maternal Grandmother   . Cancer Maternal Grandfather        unknown type? possibly lung cancer  . Diabetes Paternal Grandmother   . Colon cancer Neg Hx   . Stomach cancer Neg Hx     Social History   Socioeconomic History  . Marital status: Single    Spouse name: Not on file  . Number of children: Not on file  . Years of education: Not on file  . Highest education level: Not on file  Social Needs  .  Financial resource strain: Not on file  . Food insecurity - worry: Not on file  . Food insecurity - inability: Not on file  . Transportation needs - medical: Not on file  . Transportation needs - non-medical: Not on file  Occupational History  . Not on file  Tobacco Use  . Smoking status: Never Smoker  . Smokeless tobacco: Never Used  Substance and Sexual Activity  . Alcohol use: Yes    Alcohol/week: 0.0 oz    Comment: very rare  . Drug use: No  . Sexual activity: No  Other Topics Concern  . Not on file  Social History Narrative  . Not on file      Review of systems: Review of Systems  Constitutional: Negative for fever and chills.  HENT: Negative.   Eyes: Negative for blurred vision.  Respiratory: Negative for cough, shortness of breath and wheezing.   Cardiovascular: Negative for chest pain and palpitations.  Gastrointestinal: as per HPI Genitourinary: Negative for dysuria, urgency, frequency and hematuria.  Musculoskeletal: Negative for myalgias, back pain and joint pain.  Skin: Negative for itching and rash.  Neurological: Negative for dizziness, tremors, focal weakness, seizures and loss of consciousness.  Endo/Heme/Allergies: Positive for seasonal allergies.  Psychiatric/Behavioral: Negative for depression, suicidal ideas and hallucinations.  All other systems reviewed and are negative.   Physical Exam: Vitals:   10/22/17 1538  BP: 110/72  Pulse: 68   Body mass index is 38.31 kg/m. Gen:      No acute distress HEENT:  EOMI, sclera anicteric Neck:     No masses; no thyromegaly Lungs:    Clear to auscultation bilaterally; normal respiratory effort CV:         Regular rate and rhythm; no murmurs Abd:      + bowel sounds; soft, non-tender; no palpable masses, no distension Ext:    No edema; adequate peripheral perfusion Skin:      Warm and dry; no rash Neuro: alert and oriented x 3 Psych: normal mood and affect Rectal exam: Normal anal sphincter tone,  multiple skin tags and small thrombosed external hemorrhoids in the left lateral position, nontender on palpation Anoscopy: Small internal hemorrhoids, + posterior anal fissure with active oozing of blood   Data Reviewed:  Reviewed labs, radiology imaging, old records and pertinent past GI work up   Assessment and Plan/Recommendations:  42 year old female with traumatic brain injury, memory loss and speech deficit is here accompanied by her mother with complaints of rectal discomfort and bright red blood per rectum Patient does have an anal fissure on exam and a small thrombosed external hemorrhoid.  She has small internal hemorrhoids, did not perform hemorrhoidal band ligation. Continue Linzess 290 mcg daily Increase  dietary fiber and fluid intake Benefiber or any soluble fiber 1 tablespoon 3 times daily with meals Small pea-sized amount per rectum of 0.125% nitroglycerin 3 times daily for 6-8 weeks Follow-up with Dr. Ardis Hughs in 2 months  25 minutes was spent face-to-face with the patient. Greater than 50% of the time used for counseling as well as treatment plan and follow-up.   Damaris Hippo , MD [phone] Mon-Fri 8a-5p [phone] after 5p, weekends, holidays  CC: Reynold Bowen, MD

## 2017-11-21 ENCOUNTER — Encounter: Payer: Medicaid Other | Admitting: Gastroenterology

## 2017-12-05 ENCOUNTER — Encounter: Payer: Self-pay | Admitting: Obstetrics & Gynecology

## 2017-12-05 ENCOUNTER — Ambulatory Visit (INDEPENDENT_AMBULATORY_CARE_PROVIDER_SITE_OTHER): Payer: Medicaid Other | Admitting: Obstetrics & Gynecology

## 2017-12-05 VITALS — BP 118/80 | Ht 68.0 in | Wt 264.0 lb

## 2017-12-05 DIAGNOSIS — Z01419 Encounter for gynecological examination (general) (routine) without abnormal findings: Secondary | ICD-10-CM | POA: Diagnosis not present

## 2017-12-05 DIAGNOSIS — Z3009 Encounter for other general counseling and advice on contraception: Secondary | ICD-10-CM

## 2017-12-05 DIAGNOSIS — Z0000 Encounter for general adult medical examination without abnormal findings: Secondary | ICD-10-CM

## 2017-12-05 DIAGNOSIS — Z6841 Body Mass Index (BMI) 40.0 and over, adult: Secondary | ICD-10-CM

## 2017-12-05 NOTE — Progress Notes (Signed)
Christine Koch [birth year] [id number]   History:    42 y.o. G0 Single.  Presented with her mother  RP:  Established patient presenting for annual gyn exam   HPI: H/O Head trauma/Hydrocephaly.  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  Menstrual periods regular every months.  No pelvic pain.  Normal vaginal secretions.  Abstinent.  Treated for urinary urgency.  Bowel movements normal.  Breasts normal.  Body mass index 40.14.  Past medical history,surgical history, family history and social history were all reviewed and documented in the EPIC chart.  Gynecologic History Patient's last menstrual period was 11/28/2017. Contraception: abstinence Last Pap: 08/2016. Results were: Negative Last mammogram: Never Bone Density: Never Colonoscopy: 02/2017  Obstetric History OB History  Gravida Para Term Preterm AB Living  0 0 0 0 0 0  SAB TAB Ectopic Multiple Live Births  0 0 0 0 0     ROS: A ROS was performed and pertinent positives and negatives are included in the history.  GENERAL: No fevers or chills. HEENT: No change in vision, no earache, sore throat or sinus congestion. NECK: No pain or stiffness. CARDIOVASCULAR: No chest pain or pressure. No palpitations. PULMONARY: No shortness of breath, cough or wheeze. GASTROINTESTINAL: No abdominal pain, nausea, vomiting or diarrhea, melena or bright red blood per rectum. GENITOURINARY: No urinary frequency, urgency, hesitancy or dysuria. MUSCULOSKELETAL: No joint or muscle pain, no back pain, no recent trauma. DERMATOLOGIC: No rash, no itching, no lesions. ENDOCRINE: No polyuria, polydipsia, no heat or cold intolerance. No recent change in weight. HEMATOLOGICAL: No anemia or easy bruising or bleeding. NEUROLOGIC: No headache, seizures, numbness, tingling or weakness. PSYCHIATRIC: No depression, no loss of interest in normal activity or change in sleep pattern.     Exam:   BP 118/80 (BP Location: Right Arm, Patient  Position: Sitting, Cuff Size: Large)   Ht 5\' 8"  (1.727 m)   Wt 264 lb (119.7 kg)   LMP 11/28/2017   BMI 40.14 kg/m   Body mass index is 40.14 kg/m.  General appearance : Well developed well nourished female. No acute distress HEENT: Eyes: no retinal hemorrhage or exudates,  Neck supple, trachea midline, no carotid bruits, no thyroidmegaly Lungs: Clear to auscultation, no rhonchi or wheezes, or rib retractions  Heart: Regular rate and rhythm, no murmurs or gallops Breast:Examined in sitting and supine position were symmetrical in appearance, no palpable masses or tenderness,  no skin retraction, no nipple inversion, no nipple discharge, no skin discoloration, no axillary or supraclavicular lymphadenopathy Abdomen: no palpable masses or tenderness, no rebound or guarding Extremities: no edema or skin discoloration or tenderness  Pelvic: Vulva: Normal             Vagina: No gross lesions or discharge  Cervix: No gross lesions or discharge.  Pap reflex done.  Uterus  AV, normal size, shape and consistency, non-tender and mobile  Adnexa  Without masses or tenderness  Anus: Normal   Assessment/Plan:  42 y.o. female for annual exam   1. Encounter for routine gynecological examination with Papanicolaou smear of cervix Normal gynecologic exam.  Pap reflex done.  Breast exam normal.  2. Encounter for other general counseling or advice on contraception Abstinence.  Decline contraception.  Will use condoms if sexually active.  3. Class 3 severe obesity due to excess calories without serious comorbidity with body mass index (BMI) of 40.0 to 44.9 in adult [facility]) Low calorie/carb diet recommended.  Regular aerobic activities 5 times a week and  weight lifting every 2 days recommended.  Princess Bruins MD, 11:51 AM 12/05/2017

## 2017-12-06 ENCOUNTER — Other Ambulatory Visit: Payer: Self-pay | Admitting: Obstetrics & Gynecology

## 2017-12-06 DIAGNOSIS — Z1231 Encounter for screening mammogram for malignant neoplasm of breast: Secondary | ICD-10-CM

## 2017-12-07 LAB — PAP IG W/ RFLX HPV ASCU

## 2017-12-09 ENCOUNTER — Encounter: Payer: Self-pay | Admitting: Obstetrics & Gynecology

## 2017-12-09 NOTE — Patient Instructions (Signed)
1. Encounter for routine gynecological examination with Papanicolaou smear of cervix Normal gynecologic exam.  Pap reflex done.  Breast exam normal.  2. Encounter for other general counseling or advice on contraception Abstinence.  Decline contraception.  Will use condoms if sexually active.  3. Class 3 severe obesity due to excess calories without serious comorbidity with body mass index (BMI) of 40.0 to 44.9 in adult [facility]) Low calorie/carb diet recommended.  Regular aerobic activities 5 times a week and weight lifting every 2 days recommended.  Nhi, it was a pleasure meeting you today!  I will inform you of your results as soon as they are available.  Health Maintenance, Female Adopting a healthy lifestyle and getting preventive care can go a long way to promote health and wellness. Talk with your health care provider about what schedule of regular examinations is right for you. This is a good chance for you to check in with your provider about disease prevention and staying healthy. In between checkups, there are plenty of things you can do on your own. Experts have done a lot of research about which lifestyle changes and preventive measures are most likely to keep you healthy. Ask your health care provider for more information. Weight and diet Eat a healthy diet  Be sure to include plenty of vegetables, fruits, low-fat dairy products, and lean protein.  Do not eat a lot of foods high in solid fats, added sugars, or salt.  Get regular exercise. This is one of the most important things you can do for your health. ? Most adults should exercise for at least 150 minutes each week. The exercise should increase your heart rate and make you sweat (moderate-intensity exercise). ? Most adults should also do strengthening exercises at least twice a week. This is in addition to the moderate-intensity exercise.  Maintain a healthy weight  Body mass index (BMI) is a measurement that can be  used to identify possible weight problems. It estimates body fat based on height and weight. Your health care provider can help determine your BMI and help you achieve or maintain a healthy weight.  For females 34 years of age and older: ? A BMI below 18.5 is considered underweight. ? A BMI of 18.5 to 24.9 is normal. ? A BMI of 25 to 29.9 is considered overweight. ? A BMI of 30 and above is considered obese.  Watch levels of cholesterol and blood lipids  You should start having your blood tested for lipids and cholesterol at 41 years of age, then have this test every 5 years.  You may need to have your cholesterol levels checked more often if: ? Your lipid or cholesterol levels are high. ? You are older than 41 years of age. ? You are at high risk for heart disease.  Cancer screening Lung Cancer  Lung cancer screening is recommended for adults 49-56 years old who are at high risk for lung cancer because of a history of smoking.  A yearly low-dose CT scan of the lungs is recommended for people who: ? Currently smoke. ? Have quit within the past 15 years. ? Have at least a 30-pack-year history of smoking. A pack year is smoking an average of one pack of cigarettes a day for 1 year.  Yearly screening should continue until it has been 15 years since you quit.  Yearly screening should stop if you develop a health problem that would prevent you from having lung cancer treatment.  Breast Cancer  Practice  breast self-awareness. This means understanding how your breasts normally appear and feel.  It also means doing regular breast self-exams. Let your health care provider know about any changes, no matter how small.  If you are in your 20s or 30s, you should have a clinical breast exam (CBE) by a health care provider every 1-3 years as part of a regular health exam.  If you are 63 or older, have a CBE every year. Also consider having a breast X-ray (mammogram) every year.  If you have  a family history of breast cancer, talk to your health care provider about genetic screening.  If you are at high risk for breast cancer, talk to your health care provider about having an MRI and a mammogram every year.  Breast cancer gene (BRCA) assessment is recommended for women who have family members with BRCA-related cancers. BRCA-related cancers include: ? Breast. ? Ovarian. ? Tubal. ? Peritoneal cancers.  Results of the assessment will determine the need for genetic counseling and BRCA1 and BRCA2 testing.  Cervical Cancer Your health care provider may recommend that you be screened regularly for cancer of the pelvic organs (ovaries, uterus, and vagina). This screening involves a pelvic examination, including checking for microscopic changes to the surface of your cervix (Pap test). You may be encouraged to have this screening done every 3 years, beginning at age 60.  For women ages 49-65, health care providers may recommend pelvic exams and Pap testing every 3 years, or they may recommend the Pap and pelvic exam, combined with testing for human papilloma virus (HPV), every 5 years. Some types of HPV increase your risk of cervical cancer. Testing for HPV may also be done on women of any age with unclear Pap test results.  Other health care providers may not recommend any screening for nonpregnant women who are considered low risk for pelvic cancer and who do not have symptoms. Ask your health care provider if a screening pelvic exam is right for you.  If you have had past treatment for cervical cancer or a condition that could lead to cancer, you need Pap tests and screening for cancer for at least 20 years after your treatment. If Pap tests have been discontinued, your risk factors (such as having a new sexual partner) need to be reassessed to determine if screening should resume. Some women have medical problems that increase the chance of getting cervical cancer. In these cases, your  health care provider may recommend more frequent screening and Pap tests.  Colorectal Cancer  This type of cancer can be detected and often prevented.  Routine colorectal cancer screening usually begins at 41 years of age and continues through 41 years of age.  Your health care provider may recommend screening at an earlier age if you have risk factors for colon cancer.  Your health care provider may also recommend using home test kits to check for hidden blood in the stool.  A small camera at the end of a tube can be used to examine your colon directly (sigmoidoscopy or colonoscopy). This is done to check for the earliest forms of colorectal cancer.  Routine screening usually begins at age 9.  Direct examination of the colon should be repeated every 5-10 years through 41 years of age. However, you may need to be screened more often if early forms of precancerous polyps or small growths are found.  Skin Cancer  Check your skin from head to toe regularly.  Tell your health care provider  new moles or changes in moles, especially if there is a change in a mole's shape or color.  Also tell your health care provider if you have a mole that is larger than the size of a pencil eraser.  Always use sunscreen. Apply sunscreen liberally and repeatedly throughout the day.  Protect yourself by wearing long sleeves, pants, a wide-brimmed hat, and sunglasses whenever you are outside.  Heart disease, diabetes, and high blood pressure  High blood pressure causes heart disease and increases the risk of stroke. High blood pressure is more likely to develop in: ? People who have blood pressure in the high end of the normal range (130-139/85-89 mm Hg). ? People who are overweight or obese. ? People who are African American.  If you are 18-39 years of age, have your blood pressure checked every 3-5 years. If you are 40 years of age or older, have your blood pressure checked every year. You  should have your blood pressure measured twice-once when you are at a hospital or clinic, and once when you are not at a hospital or clinic. Record the average of the two measurements. To check your blood pressure when you are not at a hospital or clinic, you can use: ? An automated blood pressure machine at a pharmacy. ? A home blood pressure monitor.  If you are between 55 years and 79 years old, ask your health care provider if you should take aspirin to prevent strokes.  Have regular diabetes screenings. This involves taking a blood sample to check your fasting blood sugar level. ? If you are at a normal weight and have a low risk for diabetes, have this test once every three years after 41 years of age. ? If you are overweight and have a high risk for diabetes, consider being tested at a younger age or more often. Preventing infection Hepatitis B  If you have a higher risk for hepatitis B, you should be screened for this virus. You are considered at high risk for hepatitis B if: ? You were born in a country where hepatitis B is common. Ask your health care provider which countries are considered high risk. ? Your parents were born in a high-risk country, and you have not been immunized against hepatitis B (hepatitis B vaccine). ? You have HIV or AIDS. ? You use needles to inject street drugs. ? You live with someone who has hepatitis B. ? You have had sex with someone who has hepatitis B. ? You get hemodialysis treatment. ? You take certain medicines for conditions, including cancer, organ transplantation, and autoimmune conditions.  Hepatitis C  Blood testing is recommended for: ? Everyone born from 1945 through 1965. ? Anyone with known risk factors for hepatitis C.  Sexually transmitted infections (STIs)  You should be screened for sexually transmitted infections (STIs) including gonorrhea and chlamydia if: ? You are sexually active and are younger than 41 years of age. ? You  are older than 41 years of age and your health care provider tells you that you are at risk for this type of infection. ? Your sexual activity has changed since you were last screened and you are at an increased risk for chlamydia or gonorrhea. Ask your health care provider if you are at risk.  If you do not have HIV, but are at risk, it may be recommended that you take a prescription medicine daily to prevent HIV infection. This is called pre-exposure prophylaxis (PrEP). You are considered at   risk if: ? You are sexually active and do not regularly use condoms or know the HIV status of your partner(s). ? You take drugs by injection. ? You are sexually active with a partner who has HIV.  Talk with your health care provider about whether you are at high risk of being infected with HIV. If you choose to begin PrEP, you should first be tested for HIV. You should then be tested every 3 months for as long as you are taking PrEP. Pregnancy  If you are premenopausal and you may become pregnant, ask your health care provider about preconception counseling.  If you may become pregnant, take 400 to 800 micrograms (mcg) of folic acid every day.  If you want to prevent pregnancy, talk to your health care provider about birth control (contraception). Osteoporosis and menopause  Osteoporosis is a disease in which the bones lose minerals and strength with aging. This can result in serious bone fractures. Your risk for osteoporosis can be identified using a bone density scan.  If you are 65 years of age or older, or if you are at risk for osteoporosis and fractures, ask your health care provider if you should be screened.  Ask your health care provider whether you should take a calcium or vitamin D supplement to lower your risk for osteoporosis.  Menopause may have certain physical symptoms and risks.  Hormone replacement therapy may reduce some of these symptoms and risks. Talk to your health care  provider about whether hormone replacement therapy is right for you. Follow these instructions at home:  Schedule regular health, dental, and eye exams.  Stay current with your immunizations.  Do not use any tobacco products including cigarettes, chewing tobacco, or electronic cigarettes.  If you are pregnant, do not drink alcohol.  If you are breastfeeding, limit how much and how often you drink alcohol.  Limit alcohol intake to no more than 1 drink per day for nonpregnant women. One drink equals 12 ounces of beer, 5 ounces of wine, or 1 ounces of hard liquor.  Do not use street drugs.  Do not share needles.  Ask your health care provider for help if you need support or information about quitting drugs.  Tell your health care provider if you often feel depressed.  Tell your health care provider if you have ever been abused or do not feel safe at home. This information is not intended to replace advice given to you by your health care provider. Make sure you discuss any questions you have with your health care provider. Document Released: 02/27/2011 Document Revised: 01/20/2016 Document Reviewed: 05/18/2015 [employer] Interactive Patient Education  2018 [employer]  

## 2017-12-10 ENCOUNTER — Encounter: Payer: Self-pay | Admitting: Gastroenterology

## 2017-12-10 ENCOUNTER — Encounter: Payer: Self-pay | Admitting: *Deleted

## 2017-12-10 ENCOUNTER — Ambulatory Visit: Payer: Medicaid Other | Admitting: Gastroenterology

## 2017-12-10 VITALS — BP 100/68 | HR 72 | Ht 68.0 in | Wt 264.0 lb

## 2017-12-10 DIAGNOSIS — K625 Hemorrhage of anus and rectum: Secondary | ICD-10-CM

## 2017-12-10 DIAGNOSIS — K602 Anal fissure, unspecified: Secondary | ICD-10-CM | POA: Diagnosis not present

## 2017-12-10 DIAGNOSIS — K649 Unspecified hemorrhoids: Secondary | ICD-10-CM

## 2017-12-10 NOTE — Patient Instructions (Addendum)
If you are age 41 or older, your body mass index should be between 23-30. Your Body mass index is 40.14 kg/m. If this is out of the aforementioned range listed, please consider follow up with your Primary Care Provider.  If you are age 37 or younger, your body mass index should be between 19-25. Your Body mass index is 40.14 kg/m. If this is out of the aformentioned range listed, please consider follow up with your Primary Care Provider.   Continue Benefiber 1 tablespoon three times daily with meals.  Continue Linzess.  Take Miralax 1 capful daily as needed at bedtime.  Increase fluids to 12 cups daily (6-7 bottles 16 ounces)  Follow up as needed with Dr. Ardis Hughs.  Thank you for choosing me and [facility].  Pat Kocher, MD

## 2017-12-10 NOTE — Progress Notes (Signed)
Christine Koch    [id number]    31-Dec-1975  Primary Care Physician:South, Annie Main, MD  Referring Physician: Reynold Bowen, MD [address] [place], [place] [address]  Chief complaint: Bright red blood per rectum  HPI:  42 year old female with history of traumatic brain injury with intermittent bright red blood per rectum secondary to anal fissure is here for follow-up visit.  No longer having blood per rectum.  She used rectal nitroglycerin for few weeks but has not been letting her mom apply it in the past few weeks.  She is having regular soft bowel movements without any straining with MiraLAX and Benefiber.  Denies any nausea, vomiting, abdominal pain, melena or bright red blood per rectum    Outpatient Encounter Medications as of 12/10/2017  Medication Sig  . AMBULATORY NON FORMULARY MEDICATION Medication Name: 0.125% Nitroglycerin gel 1 pea sized amount per rectum three times a day  . cabergoline (DOSTINEX) 0.5 MG tablet Take 0.25 mg by mouth 2 (two) times a week. Mondays and Thursdays   . DULoxetine (CYMBALTA) 30 MG capsule Take 30 mg by mouth daily.    . fluticasone (FLONASE) 50 MCG/ACT nasal spray Place into both nostrils daily.  Marland Kitchen levocetirizine (XYZAL) 5 MG tablet Take 5 mg by mouth every evening.  Marland Kitchen levothyroxine (SYNTHROID, LEVOTHROID) 175 MCG tablet Take 175 mcg by mouth daily.    Marland Kitchen linaclotide (LINZESS) 290 MCG CAPS capsule Take 290 mcg by mouth daily before breakfast.  . metFORMIN (GLUCOPHAGE) 500 MG tablet Take 1,000 mg by mouth 2 (two) times daily with a meal. One in the am and two in pm   . rosuvastatin (CRESTOR) 10 MG tablet Take 10 mg by mouth 2 (two) times a week. Mondays and Thursdays   . tolterodine (DETROL LA) 4 MG 24 hr capsule Take 4 mg by mouth daily.  Marland Kitchen topiramate (TOPAMAX) 25 MG capsule Take 50 mg by mouth 2 (two) times daily.    . verapamil (CALAN-SR) 240 MG CR tablet Take 480 mg by mouth at bedtime.    . Vitamin D, Ergocalciferol,  (DRISDOL) 50000 units CAPS capsule Take 5,000 Units by mouth once a week.   Facility-Administered Encounter Medications as of 12/10/2017  Medication  . 0.9 %  sodium chloride infusion    Allergies as of 12/10/2017 - Review Complete 12/09/2017  Allergen Reaction Noted  . Codeine      Past Medical History:  Diagnosis Date  . 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 Depression   . Dermatophytosis of nail   . Diabetes ([place])   . H/O: pneumonia   . Headache   . Impacted cerumen   . Internal hemorrhoids without mention of complication   . Other acute otitis externa   . Thyroid disease   . Unspecified cerebral artery occlusion with cerebral infarction 1995    Past Surgical History:  Procedure Laterality Date  . ADENOIDECTOMY  1979  . MYRINGOTOMY  1979  . NEUROENDOSCOPIC PLACEMENT / REPLACEMENT VENTRICULAR CATHETER W/ ATTACHMENT SHUNT / EXTERNAL DRAIN  1995   for hydrocephaly  . TONSILLECTOMY  1993  . TUMOR REMOVAL  1995   Pituitary tumor    Family History  Problem Relation Age of Onset  . Diabetes Mother   . Hypertension Mother   . Colon polyps Mother   . Diabetes Maternal Aunt   . Diabetes Maternal Grandmother   . Hypertension Maternal Grandmother   .  Heart disease Maternal Grandmother   . Cancer Maternal Grandfather        unknown type? possibly lung cancer  . Diabetes Paternal Grandmother   . Colon cancer Neg Hx   . Stomach cancer Neg Hx     Social History   Socioeconomic History  . Marital status: Single    Spouse name: Not on file  . Number of children: Not on file  . Years of education: Not on file  . Highest education level: Not on file  Occupational History  . Not on file  Social Needs  . Financial resource strain: Not on file  . Food insecurity:    Worry: Not on file    Inability: Not on file  . Transportation needs:    Medical: Not on file    Non-medical: Not on file  Tobacco Use  . Smoking status: Never  Smoker  . Smokeless tobacco: Never Used  Substance and Sexual Activity  . Alcohol use: Not on file  . Drug use: No  . Sexual activity: Not Currently    Comment: intercourse age 79 , less than 5 sexual partners  Lifestyle  . Physical activity:    Days per week: Not on file    Minutes per session: Not on file  . Stress: Not on file  Relationships  . Social connections:    Talks on phone: Not on file    Gets together: Not on file    Attends religious service: Not on file    Active member of club or organization: Not on file    Attends meetings of clubs or organizations: Not on file    Relationship status: Not on file  . Intimate partner violence:    Fear of current or ex partner: Not on file    Emotionally abused: Not on file    Physically abused: Not on file    Forced sexual activity: Not on file  Other Topics Concern  . Not on file  Social History Narrative  . Not on file      Review of systems: Review of Systems  Constitutional: Negative for fever and chills.  HENT: Positive for sinus problem with allergies Eyes: Negative for blurred vision.  Respiratory: Negative for cough, shortness of breath and wheezing.   Cardiovascular: Negative for chest pain and palpitations.  Gastrointestinal: as per HPI Genitourinary: Negative for dysuria, urgency, frequency and hematuria.  Musculoskeletal: Negative for myalgias, back pain and joint pain.  Skin: Negative for itching and rash.  Neurological: Negative for dizziness, tremors, focal weakness, seizures and loss of consciousness.  Endo/Heme/Allergies: Positive for seasonal allergies.  Psychiatric/Behavioral: Negative for depression, suicidal ideas and hallucinations.  All other systems reviewed and are negative.   Physical Exam: Vitals:   12/10/17 1345  BP: 100/68  Pulse: 72   Body mass index is 40.14 kg/m. Gen:      No acute distress HEENT:  EOMI, sclera anicteric Neck:     No masses; no thyromegaly Lungs:    Clear to  auscultation bilaterally; normal respiratory effort CV:         Regular rate and rhythm; no murmurs Abd:      + bowel sounds; soft, non-tender; no palpable masses, no distension Ext:    No edema; adequate peripheral perfusion Skin:      Warm and dry; no rash Neuro: alert and oriented x 3 Psych: normal mood and affect Rectal exam: Normal anal sphincter tone, no anal fissure or external hemorrhoids Anoscopy: Small internal  hemorrhoids, no active bleeding, normal dentate line, no visible nodules  Data Reviewed:  Reviewed labs, radiology imaging, old records and pertinent past GI work up   Assessment and Plan/Recommendations:  42 year old female here for follow-up visit of anal fissure, which has healed and is currently asymptomatic Continue Benefiber 1 tablespoon 3 times daily Continue Linzess daily MiraLAX at bedtime as needed Increase dietary fluid intake Given her symptoms have resolved and rectal bleeding and discomfort was secondary to anal fissure, opted to hold off hemorrhoidal banding Advised patient to return if has any symptoms from hemorrhoids to consider hemorrhoidal banding if needed  15 minutes was spent face-to-face with the patient. Greater than 50% of the time used for counseling as well as treatment plan and follow-up. She had multiple questions which were answered to her satisfaction  K. Denzil Magnuson , MD [phone]    CC: Reynold Bowen, MD

## 2017-12-23 ENCOUNTER — Encounter: Payer: Self-pay | Admitting: Gastroenterology

## 2017-12-31 ENCOUNTER — Ambulatory Visit
Admission: RE | Admit: 2017-12-31 | Discharge: 2017-12-31 | Disposition: A | Discharge status: Home / Self Care | Payer: Medicaid Other | Source: Ambulatory Visit | Attending: Obstetrics & Gynecology | Admitting: Obstetrics & Gynecology

## 2017-12-31 DIAGNOSIS — Z1231 Encounter for screening mammogram for malignant neoplasm of breast: Secondary | ICD-10-CM

## 2018-04-14 ENCOUNTER — Other Ambulatory Visit: Payer: Self-pay

## 2018-04-14 ENCOUNTER — Emergency Department: Payer: Medicaid Other

## 2018-04-14 ENCOUNTER — Encounter: Payer: Self-pay | Admitting: Emergency Medicine

## 2018-04-14 ENCOUNTER — Emergency Department
Admission: EM | Admit: 2018-04-14 | Discharge: 2018-04-14 | Disposition: A | Discharge status: Home / Self Care | Payer: Medicaid Other | Attending: Emergency Medicine | Admitting: Emergency Medicine

## 2018-04-14 DIAGNOSIS — E119 Type 2 diabetes mellitus without complications: Secondary | ICD-10-CM | POA: Insufficient documentation

## 2018-04-14 DIAGNOSIS — S8001XA Contusion of right knee, initial encounter: Secondary | ICD-10-CM | POA: Insufficient documentation

## 2018-04-14 DIAGNOSIS — Z7984 Long term (current) use of oral hypoglycemic drugs: Secondary | ICD-10-CM | POA: Insufficient documentation

## 2018-04-14 DIAGNOSIS — Y9301 Activity, walking, marching and hiking: Secondary | ICD-10-CM | POA: Insufficient documentation

## 2018-04-14 DIAGNOSIS — Y999 Unspecified external cause status: Secondary | ICD-10-CM | POA: Insufficient documentation

## 2018-04-14 DIAGNOSIS — S8002XA Contusion of left knee, initial encounter: Secondary | ICD-10-CM | POA: Diagnosis not present

## 2018-04-14 DIAGNOSIS — Y92129 Unspecified place in nursing home as the place of occurrence of the external cause: Secondary | ICD-10-CM | POA: Insufficient documentation

## 2018-04-14 DIAGNOSIS — Z79899 Other long term (current) drug therapy: Secondary | ICD-10-CM | POA: Diagnosis not present

## 2018-04-14 DIAGNOSIS — W19XXXA Unspecified fall, initial encounter: Secondary | ICD-10-CM

## 2018-04-14 DIAGNOSIS — W010XXA Fall on same level from slipping, tripping and stumbling without subsequent striking against object, initial encounter: Secondary | ICD-10-CM | POA: Diagnosis not present

## 2018-04-14 DIAGNOSIS — S8991XA Unspecified injury of right lower leg, initial encounter: Secondary | ICD-10-CM | POA: Diagnosis present

## 2018-04-14 MED ORDER — OXYCODONE-ACETAMINOPHEN 5-325 MG PO TABS: 1.0000 | ORAL_TABLET | Freq: Four times a day (QID) | ORAL | 0 refills | Status: DC | PRN

## 2018-04-14 NOTE — ED Provider Notes (Signed)
[facility] Emergency Department Provider Note  ____________________________________________   First MD Initiated Contact with Patient 04/14/18 1319     (approximate)  I have reviewed the triage vital signs and the nursing notes.   HISTORY  Chief Complaint Fall  HPI Christine Koch is a 42 y.o. female to the emergency department after patient failed injuring both her knees.  Patient states that she was walking in a nursing facility when her foot caught on something on the floor.  This caused her to fall forward landing directly on her knees.  Denies any head injury or loss of consciousness.  Family member states that patient has a history of a CVA and her right leg frequently gets injured.  She has an abrasion to her right anterior knee.  She rates her pain as 5/10.   Past Medical History:  Diagnosis Date  . 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 Depression   . Dermatophytosis of nail   . Diabetes ([place])   . H/O: pneumonia   . Headache   . Impacted cerumen   . Internal hemorrhoids without mention of complication   . Other acute otitis externa   . Thyroid disease   . Unspecified cerebral artery occlusion with cerebral infarction 1995    Patient Active Problem List   Diagnosis Date Noted  . Nocturia 10/30/2012  . Urgency incontinence 10/30/2012  . 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 Injury brain, traumatic ([place]) 08/10/2011  . Migraine 08/10/2011  . HEMORRHOIDS, INTERNAL 06/15/2010  . ONYCHOMYCOSIS, TOENAILS 05/07/2007  . 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 CEREBROVASCULAR ACCIDENT 11/22/2006  . PNEUMONIA, HX OF 11/22/2006    Past Surgical History:  Procedure Laterality Date  . ADENOIDECTOMY  1979  . MYRINGOTOMY  1979  . NEUROENDOSCOPIC PLACEMENT / REPLACEMENT VENTRICULAR CATHETER W/ ATTACHMENT SHUNT / EXTERNAL DRAIN  1995   for hydrocephaly  . TONSILLECTOMY  1993  . TUMOR REMOVAL  1995   Pituitary tumor    Prior to Admission medications   Medication Sig Start Date End Date Taking? Authorizing Provider  AMBULATORY NON FORMULARY MEDICATION Medication Name: 0.125% Nitroglycerin gel 1 pea sized amount per rectum three times a day Patient taking differently: as needed. Medication Name: 0.125% Nitroglycerin gel 1 pea sized amount per rectum three times a day 10/22/17   Mauri Pole, MD  cabergoline (DOSTINEX) 0.5 MG tablet Take 0.25 mg by mouth 2 (two) times a week. Mondays and Thursdays     [provider]  DULoxetine (CYMBALTA) 30 MG capsule Take 30 mg by mouth daily.      [provider]  fluticasone (FLONASE) 50 MCG/ACT nasal spray Place into both nostrils daily.    [provider]  levocetirizine (XYZAL) 5 MG tablet Take 5 mg by mouth every evening.    [provider]  levothyroxine (SYNTHROID, LEVOTHROID) 175 MCG tablet Take 175 mcg by mouth daily.      [provider]  linaclotide (LINZESS) 290 MCG CAPS capsule Take 290 mcg by mouth daily before breakfast.    [provider]  metFORMIN (GLUCOPHAGE) 500 MG tablet Take 1,000 mg by mouth 2 (two) times daily with a meal. One in the am and two in pm     [provider]  oxyCODONE-acetaminophen (PERCOCET) 5-325 MG tablet Take 1 tablet by mouth every 6 (six) hours as needed for severe pain. 04/14/18   [name], PA-C  rosuvastatin (CRESTOR) 10 MG tablet  Take 10 mg by mouth 2 (two) times a week. Mondays and Thursdays     [provider]  tolterodine (DETROL LA) 4 MG 24 hr capsule Take 4 mg by mouth daily.    [provider]  topiramate (TOPAMAX) 25 MG capsule Take 50 mg by mouth 2 (two) times daily.      [provider]  verapamil (CALAN-SR) 240 MG CR tablet Take 480 mg by mouth at bedtime.      [provider]  Vitamin D, Ergocalciferol, (DRISDOL) 50000 units CAPS capsule Take 5,000 Units by mouth once a week. 08/18/16    [provider]    Allergies Codeine  Family History  Problem Relation Age of Onset  . Diabetes Mother   . Hypertension Mother   . Colon polyps Mother   . Diabetes Maternal Aunt   . Diabetes Maternal Grandmother   . Hypertension Maternal Grandmother   . Heart disease Maternal Grandmother   . Cancer Maternal Grandfather        unknown type? possibly lung cancer  . Diabetes Paternal Grandmother   . Colon cancer Neg Hx   . Stomach cancer Neg Hx     Social History Social History   Tobacco Use  . Smoking status: Never Smoker  . Smokeless tobacco: Never Used  Substance Use Topics  . Alcohol use: Not on file  . Drug use: No    Review of Systems Constitutional: No fever/chills Eyes: No visual changes. ENT: Trauma. Cardiovascular: Denies chest pain. Respiratory: Denies shortness of breath. Gastrointestinal: No abdominal pain.  No nausea, no vomiting.   Musculoskeletal: Bilateral knee pain. Skin: Positive abrasion right knee. Neurological: Negative for headaches, focal weakness or numbness.  Positive status post CVA. ____________________________________________   PHYSICAL EXAM:  VITAL SIGNS: ED Triage Vitals  Enc Vitals Group     BP 04/14/18 1259 122/60     Pulse Rate 04/14/18 1259 69     Resp 04/14/18 1259 17     Temp 04/14/18 1259 98.3 F (36.8 C)     Temp Source 04/14/18 1259 Oral     SpO2 04/14/18 1259 98 %     Weight 04/14/18 1258 270 lb (122.5 kg)     Height 04/14/18 1258 5\' 10"  (1.778 m)     Head Circumference --      Peak Flow --      Pain Score 04/14/18 1258 5     Pain Loc --      Pain Edu? --      Excl. in [place]? --    Constitutional: Alert and oriented. Well appearing and in no acute distress. Eyes: Conjunctivae are normal. PERRL. EOMI. Head: Atraumatic. Nose: No injury. Neck: No stridor.   Cardiovascular: Normal rate, regular rhythm. Grossly normal heart sounds.  Good peripheral circulation. Respiratory: Normal respiratory effort.  No  retractions. Lungs CTAB. Gastrointestinal: Soft and nontender. No distention.  Musculoskeletal: Examination of the lower extremities there is moderate tenderness to light palpation of the patellas bilaterally.  Right knee does have superficial skin abrasion without evidence of bleeding.  There is soft tissue swelling and ecchymosis present bilaterally.  Patient range of motion is restricted secondary to discomfort.  No tenderness is noted lower extremities bilaterally and injury mainly involves the knees. Neurologic:  Normal speech and language. No gross focal neurologic deficits are appreciated. No gait instability. Skin:  Skin is warm, dry and intact. No rash noted. Psychiatric: Mood and affect are normal. Speech and behavior are normal.  ____________________________________________   LABS (all labs ordered are listed, but only abnormal results are displayed)  Labs Reviewed - No data to display  RADIOLOGY  ED MD interpretation:   Left knee negative for fracture. Right knee with questionable old injury to the patella.  Official radiology report(s): Dg Knee Complete 4 Views Left  Result Date: 04/14/2018 CLINICAL DATA:  Fall onto both knees today with pain. EXAM: LEFT KNEE - COMPLETE 4+ VIEW COMPARISON:  None. FINDINGS: No evidence of fracture, dislocation, or joint effusion. No evidence of arthropathy or other focal bone abnormality. Soft tissues are unremarkable. IMPRESSION: Negative. Electronically Signed   By: Marin Olp M.D.   On: 04/14/2018 14:54   Dg Knee Complete 4 Views Right  Result Date: 04/14/2018 CLINICAL DATA:  Fall onto both knees with right knee pain. EXAM: RIGHT KNEE - COMPLETE 4+ VIEW COMPARISON:  None. FINDINGS: Well-defined 2 cm calcification projects over the proximal patella tendon just below the patella. No evidence of acute fracture or dislocation. No significant joint effusion. IMPRESSION: No acute findings. Electronically Signed   By: Marin Olp M.D.   On:  04/14/2018 14:58    ____________________________________________   PROCEDURES  Procedure(s) performed: None  Procedures  Critical Care performed: No  ____________________________________________   INITIAL IMPRESSION / ASSESSMENT AND PLAN / ED COURSE  As part of my medical decision making, I reviewed the following data within the electronic MEDICAL RECORD NUMBER Notes from prior ED visits and [place] Controlled Substance Database  Patient is brought by mother with complaint of bilateral knee pain after falling in a nursing facility visiting a family member.  There was no history of head injury or loss of consciousness.  Patient falls often per mother due to her CVA history.  X-rays were reassuring that there was no acute injury.  Mother states that she will most likely give naproxen for her pain.  A prescription for [facility]et was sent to [facility] in the event that she needs something stronger for pain.  In the meantime she will ice and elevate her knees to reduce swelling and help with pain.  Follow-up with her PCP if any continued problems.  ____________________________________________   FINAL CLINICAL IMPRESSION(S) / ED DIAGNOSES  Final diagnoses:  Contusion of right knee, initial encounter  Contusion of left knee, initial encounter  Fall, initial encounter     ED Discharge Orders         Ordered    oxyCODONE-acetaminophen (PERCOCET) 5-325 MG tablet  Every 6 hours PRN     04/14/18 1509           Note:  This document was prepared using Dragon voice recognition software and may include unintentional dictation errors.    [name], PA-C 04/14/18 1553    Delman Kitten, MD 04/16/18 1040

## 2018-04-14 NOTE — ED Notes (Signed)
NAD noted at time of D/C. Pt's mother denies questions or concerns. Pt taken to the lobby via wheelchair at this time.

## 2018-04-14 NOTE — Discharge Instructions (Addendum)
[place] up with your primary care provider if any continued problems.   Ice and elevate as needed for swelling. Begin taking naproxen twice a day as needed for discomfort.  [facility] is a [facility] [place] for medication that is stronger.  Be aware that this medication could cause drowsiness and increase her risk for falling.  Make sure that you eat with this medication.  Should you have any GI upset discontinue taking this medication.

## 2018-04-14 NOTE — ED Triage Notes (Signed)
Pt comes into the ED via POV with her mother c/o a fall yesterday where she fell on both knees.  Patient now having bilateral knee pain and has bruising noted to them.  Patient in NAD at this time with even and unlabored respirations.

## 2018-06-20 ENCOUNTER — Other Ambulatory Visit: Payer: Self-pay | Admitting: Endocrinology

## 2018-06-20 DIAGNOSIS — D352 Benign neoplasm of pituitary gland: Secondary | ICD-10-CM

## 2018-06-20 DIAGNOSIS — Z982 Presence of cerebrospinal fluid drainage device: Secondary | ICD-10-CM

## 2018-06-29 ENCOUNTER — Ambulatory Visit
Admission: RE | Admit: 2018-06-29 | Discharge: 2018-06-29 | Disposition: A | Discharge status: Home / Self Care | Payer: Medicaid Other | Source: Ambulatory Visit | Attending: Endocrinology | Admitting: Endocrinology

## 2018-06-29 DIAGNOSIS — Z982 Presence of cerebrospinal fluid drainage device: Secondary | ICD-10-CM

## 2018-06-29 DIAGNOSIS — D352 Benign neoplasm of pituitary gland: Secondary | ICD-10-CM

## 2018-06-29 MED ORDER — GADOBENATE DIMEGLUMINE 529 MG/ML IV SOLN
10.0000 mL | Freq: Once | INTRAVENOUS | Status: AC | PRN
  Administered 2018-06-29: 10 mL via INTRAVENOUS

## 2018-09-24 ENCOUNTER — Ambulatory Visit (INDEPENDENT_AMBULATORY_CARE_PROVIDER_SITE_OTHER): Payer: Medicaid Other | Admitting: Internal Medicine

## 2018-09-24 ENCOUNTER — Encounter: Payer: Self-pay | Admitting: Internal Medicine

## 2018-09-24 VITALS — BP 110/68 | HR 62 | Temp 97.8°F | Wt 261.0 lb

## 2018-09-24 DIAGNOSIS — N898 Other specified noninflammatory disorders of vagina: Secondary | ICD-10-CM

## 2018-09-24 DIAGNOSIS — T192XXA Foreign body in vulva and vagina, initial encounter: Secondary | ICD-10-CM | POA: Diagnosis not present

## 2018-09-24 NOTE — Patient Instructions (Signed)
Vaginitis    Vaginitis is irritation and swelling (inflammation) of the vagina. It happens when normal bacteria and yeast in the vagina grow too much. There are many types of this condition. Treatment will depend on the type you have.  Follow these instructions at home:  Lifestyle  · Keep your vagina area clean and dry.  ? Avoid using soap.  ? Rinse the area with water.  · Do not do the following until your doctor says it is okay:  ? Wash and clean out the vagina (douche).  ? Use tampons.  ? Have sex.  · Wipe from front to back after going to the bathroom.  · Let air reach your vagina.  ? Wear cotton underwear.  ? Do not wear:  ? Underwear while you sleep.  ? Tight pants.  ? Thong underwear.  ? Underwear or nylons without a cotton panel.  ? Take off any wet clothing, such as bathing suits, as soon as possible.  · Use gentle, non-scented products. Do not use things that can irritate the vagina, such as fabric softeners. Avoid the following products if they are scented:  ? Feminine sprays.  ? Detergents.  ? Tampons.  ? Feminine hygiene products.  ? Soaps or bubble baths.  · Practice safe sex and use condoms.  General instructions  · Take over-the-counter and prescription medicines only as told by your doctor.  · If you were prescribed an antibiotic medicine, take or use it as told by your doctor. Do not stop taking or using the antibiotic even if you start to feel better.  · Keep all follow-up visits as told by your doctor. This is important.  Contact a doctor if:  · You have pain in your belly.  · You have a fever.  · Your symptoms last for more than 2-3 days.  Get help right away if:  · You have a fever and your symptoms get worse all of a sudden.  Summary  · Vaginitis is irritation and swelling of the vagina. It can happen when the normal bacteria and yeast in the vagina grow too much. There are many types.  · Treatment will depend on the type you have.  · Do not douche, use tampons , or have sex until your health  care provider approves. When you can return to sex, practice safe sex and use condoms.  This information is not intended to replace advice given to you by your health care provider. Make sure you discuss any questions you have with your health care provider.  Document Released: 11/10/2008 Document Revised: 09/05/2016 Document Reviewed: 09/05/2016  Elsevier Interactive Patient Education © 2019 [employer]

## 2018-09-24 NOTE — Progress Notes (Signed)
Subjective:    Patient ID: Christine Koch, female    DOB: 09/06/1975, 43 y.o.   MRN: [id number]  HPI  Pt presents to the clinic today with c/o vaginal odor. This started 2 days ago. She describes the odor as very foul. She did just get off her menstrual cycle. She denies pelvic pain, vaginal discharge, irritation and itching. She has never been sexually active. She has not tried anything OTC for this.  Review of Systems  Past Medical History:  Diagnosis Date  . 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 Depression   . Dermatophytosis of nail   . Diabetes ([place])   . H/O: pneumonia   . Headache   . Impacted cerumen   . Internal hemorrhoids without mention of complication   . Other acute otitis externa   . Thyroid disease   . Unspecified cerebral artery occlusion with cerebral infarction 1995    Current Outpatient Medications  Medication Sig Dispense Refill  . AMBULATORY NON FORMULARY MEDICATION Medication Name: 0.125% Nitroglycerin gel 1 pea sized amount per rectum three times a day (Patient taking differently: as needed. Medication Name: 0.125% Nitroglycerin gel 1 pea sized amount per rectum three times a day) 30 g 1  . cabergoline (DOSTINEX) 0.5 MG tablet Take 0.25 mg by mouth 2 (two) times a week. Mondays and Thursdays     . DULoxetine (CYMBALTA) 30 MG capsule Take 30 mg by mouth daily.      . fluticasone (FLONASE) 50 MCG/ACT nasal spray Place into both nostrils daily.    Marland Kitchen levocetirizine (XYZAL) 5 MG tablet Take 5 mg by mouth every evening.    Marland Kitchen levothyroxine (SYNTHROID, LEVOTHROID) 175 MCG tablet Take 175 mcg by mouth daily.      Marland Kitchen linaclotide (LINZESS) 290 MCG CAPS capsule Take 290 mcg by mouth daily before breakfast.    . metFORMIN (GLUCOPHAGE) 500 MG tablet Take 1,000 mg by mouth 2 (two) times daily with a meal. One in the am and two in pm     . rosuvastatin (CRESTOR) 10 MG tablet Take 10 mg by mouth 2 (two) times a week. Mondays and  Thursdays     . tolterodine (DETROL LA) 4 MG 24 hr capsule Take 4 mg by mouth daily.    Marland Kitchen topiramate (TOPAMAX) 25 MG capsule Take 50 mg by mouth 2 (two) times daily.      . verapamil (CALAN-SR) 240 MG CR tablet Take 480 mg by mouth at bedtime.      . Vitamin D, Ergocalciferol, (DRISDOL) 50000 units CAPS capsule Take 5,000 Units by mouth once a week.  6   No current facility-administered medications for this visit.     Allergies  Allergen Reactions  . Codeine     REACTION: Vomiting    Family History  Problem Relation Age of Onset  . Diabetes Mother   . Hypertension Mother   . Colon polyps Mother   . Diabetes Maternal Aunt   . Diabetes Maternal Grandmother   . Hypertension Maternal Grandmother   . Heart disease Maternal Grandmother   . Cancer Maternal Grandfather        unknown type? possibly lung cancer  . Diabetes Paternal Grandmother   . Colon cancer Neg Hx   . Stomach cancer Neg Hx     Social History   Socioeconomic History  . Marital status: Single    Spouse name: Not on file  . Number of children: Not on file  .  Years of education: Not on file  . Highest education level: Not on file  Occupational History  . Not on file  Social Needs  . Financial resource strain: Not on file  . Food insecurity:    Worry: Not on file    Inability: Not on file  . Transportation needs:    Medical: Not on file    Non-medical: Not on file  Tobacco Use  . Smoking status: Never Smoker  . Smokeless tobacco: Never Used  Substance and Sexual Activity  . Alcohol use: Not on file  . Drug use: No  . Sexual activity: Not Currently    Comment: intercourse age 13 , less than 5 sexual partners  Lifestyle  . Physical activity:    Days per week: Not on file    Minutes per session: Not on file  . Stress: Not on file  Relationships  . Social connections:    Talks on phone: Not on file    Gets together: Not on file    Attends religious service: Not on file    Active member of club or  organization: Not on file    Attends meetings of clubs or organizations: Not on file    Relationship status: Not on file  . Intimate partner violence:    Fear of current or ex partner: Not on file    Emotionally abused: Not on file    Physically abused: Not on file    Forced sexual activity: Not on file  Other Topics Concern  . Not on file  Social History Narrative  . Not on file     Constitutional: Denies fever, malaise, fatigue, headache or abrupt weight changes.  Gastrointestinal: Denies abdominal pain, bloating, constipation, diarrhea or blood in the stool.  GU: Pt reports vaginal odor. Denies urgency, frequency, pain with urination, burning sensation, blood in urine, or discharge.   No other specific complaints in a complete review of systems (except as listed in HPI above).     Objective:   Physical Exam  Wt 261 lb (118.4 kg)   LMP 09/20/2018   BMI 37.45 kg/m  Wt Readings from Last 3 Encounters:  09/24/18 261 lb (118.4 kg)  04/14/18 270 lb (122.5 kg)  12/10/17 264 lb (119.7 kg)    General: Appears her stated age, obese, in NAD. Cardiovascular: Normal rate and rhythm. S1,S2 noted.  No murmur, rubs or gallops noted.  Pulmonary/Chest: Normal effort and positive vesicular breath sounds. No respiratory distress. No wheezes, rales or ronchi noted.  Abdomen: Soft and nontender. Normal bowel sounds. No distention or masses noted.  Pelvic: Normal female anatomy. Retained tampon noted in vaginal vault- removed. Clots also noted in vaginal vault. Cervix without changes. Foul odor present. Musculoskeletal: Normal range of motion. No signs of joint swelling. No difficulty with gait.  Neurological: Alert and oriented.   BMET    Component Value Date/Time   NA 139 03/07/2017 1109   K 3.7 03/07/2017 1109   CL 107 03/07/2017 1109   CO2 26 03/07/2017 1109   GLUCOSE 156 (H) 03/07/2017 1109   BUN 16 03/07/2017 1109   CREATININE 0.82 03/07/2017 1109   CALCIUM 9.3 03/07/2017 1109     Lipid Panel  No results found for: CHOL, TRIG, HDL, CHOLHDL, VLDL, LDLCALC  CBC    Component Value Date/Time   WBC 10.0 03/07/2017 1109   RBC 3.83 (L) 03/07/2017 1109   HGB 11.5 (L) 10/16/2017 1219   HCT 34.8 (L) 03/07/2017 1109   PLT  310.0 03/07/2017 1109   MCV 90.7 03/07/2017 1109   MCHC 34.0 03/07/2017 1109   RDW 13.1 03/07/2017 1109   LYMPHSABS 3.2 03/07/2017 1109   MONOABS 0.7 03/07/2017 1109   EOSABS 0.3 03/07/2017 1109   BASOSABS 0.0 03/07/2017 1109    Hgb A1C No results found for: HGBA1C          Assessment & Plan:   Vaginal Odor, Retained Tampon:  Tampon removed with forceps Pt tolerated well No complications Clots cleaned out with cotton swab No s/s of PID  Return precautions discussed Webb Silversmith, NP

## 2018-10-09 ENCOUNTER — Encounter: Payer: Self-pay | Admitting: Family Medicine

## 2018-10-09 ENCOUNTER — Ambulatory Visit (INDEPENDENT_AMBULATORY_CARE_PROVIDER_SITE_OTHER): Payer: Medicaid Other | Admitting: Family Medicine

## 2018-10-09 VITALS — BP 90/60 | HR 71 | Temp 97.6°F | Ht 68.0 in | Wt 262.8 lb

## 2018-10-09 DIAGNOSIS — M7711 Lateral epicondylitis, right elbow: Secondary | ICD-10-CM

## 2018-10-09 NOTE — Progress Notes (Signed)
Dr. Frederico Hamman T. Yeraldine Forney, MD, [facility] [place], [address] Phone: [phone] Fax: [phone]  10/09/2018  Patient: Christine Koch, MRN: [id number], DOB: [birth year], 43 y.o.  Primary Physician:  Reynold Bowen, MD   Chief Complaint  Patient presents with  . Elbow Pain    Right   Subjective:   Christine Koch presents with lateral elbow pain.  Length of symptoms: 6 weeks Hand effected: R  Patient describes a dull ache on the lateral elbow. There is some translation in the proximal forearm and in the distal upper arm. It is painful to lift with the hand facing down and to lift with the thumb in an upright position. Supination is painful. Patient points to the lateral epicondyle as the point of maximal tenderness near ECRB.  Christine Koch is a very well-known patient who presents with left-sided elbow pain.   6 weeks LE on the R.   Job 2 days a week repetitive.   No trauma.   No prior fractures or operative interventions in the effective hand. Prior PT or HEP: none  Denies numbness or tingling. No significant neck or shoulder pain.  Hand of dominance: LHD  The PMH, PSH, Social History, Family History, Medications, and allergies have been reviewed in [facility], and have been updated if relevant.  Patient Active Problem List   Diagnosis Date Noted  . Nocturia 10/30/2012  . Urgency incontinence 10/30/2012  . 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 Migraine 08/10/2011  . HEMORRHOIDS, INTERNAL 06/15/2010  . ONYCHOMYCOSIS, TOENAILS 05/07/2007  . 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 CEREBROVASCULAR ACCIDENT 11/22/2006  . PNEUMONIA, HX OF 11/22/2006    Past Medical History:  Diagnosis Date  . 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 Depression   . Dermatophytosis of nail   . Diabetes ([place])   . H/O: pneumonia   . Headache   .  Impacted cerumen   . Internal hemorrhoids without mention of complication   . Other acute otitis externa   . Thyroid disease   . Unspecified cerebral artery occlusion with cerebral infarction 1995    Past Surgical History:  Procedure Laterality Date  . ADENOIDECTOMY  1979  . MYRINGOTOMY  1979  . NEUROENDOSCOPIC PLACEMENT / REPLACEMENT VENTRICULAR CATHETER W/ ATTACHMENT SHUNT / EXTERNAL DRAIN  1995   for hydrocephaly  . TONSILLECTOMY  1993  . TUMOR REMOVAL  1995   Pituitary tumor    Social History   Socioeconomic History  . Marital status: Single    Spouse name: Not on file  . Number of children: Not on file  . Years of education: Not on file  . Highest education level: Not on file  Occupational History  . Not on file  Social Needs  . Financial resource strain: Not on file  . Food insecurity:    Worry: Not on file    Inability: Not on file  . Transportation needs:    Medical: Not on file    Non-medical: Not on file  Tobacco Use  . Smoking status: Never Smoker  . Smokeless tobacco: Never Used  Substance and Sexual Activity  . Alcohol use: Not on file  . Drug use: No  . Sexual activity: Not Currently    Comment: intercourse age 66 , less than 5 sexual partners  Lifestyle  . Physical activity:    Days per week: Not on  file    Minutes per session: Not on file  . Stress: Not on file  Relationships  . Social connections:    Talks on phone: Not on file    Gets together: Not on file    Attends religious service: Not on file    Active member of club or organization: Not on file    Attends meetings of clubs or organizations: Not on file    Relationship status: Not on file  . Intimate partner violence:    Fear of current or ex partner: Not on file    Emotionally abused: Not on file    Physically abused: Not on file    Forced sexual activity: Not on file  Other Topics Concern  . Not on file  Social History Narrative  . Not on file    Family History  Problem  Relation Age of Onset  . Diabetes Mother   . Hypertension Mother   . Colon polyps Mother   . Diabetes Maternal Aunt   . Diabetes Maternal Grandmother   . Hypertension Maternal Grandmother   . Heart disease Maternal Grandmother   . Cancer Maternal Grandfather        unknown type? possibly lung cancer  . Diabetes Paternal Grandmother   . Colon cancer Neg Hx   . Stomach cancer Neg Hx     Allergies  Allergen Reactions  . Codeine     REACTION: Vomiting    Medication list reviewed and updated in full in [facility].  GEN: No fevers, chills. Nontoxic. Primarily MSK c/o today. MSK: Detailed in the HPI GI: tolerating PO intake without difficulty Neuro: No numbness, parasthesias, or tingling associated. Otherwise the pertinent positives of the ROS are noted above.   Objective:   Blood pressure 90/60, pulse 71, temperature 97.6 F (36.4 C), temperature source Oral, height 5\' 8"  (1.727 m), weight 262 lb 12 oz (119.2 kg), last menstrual period 09/20/2018.  GEN: Well-developed,well-nourished,in no acute distress; alert,appropriate and cooperative throughout examination HEENT: Normocephalic and atraumatic without obvious abnormalities. Ears, externally no deformities PULM: Breathing comfortably in no respiratory distress EXT: No clubbing, cyanosis, or edema PSYCH: Normally interactive. Cooperative during the interview. Pleasant. Friendly and conversant. Not anxious or depressed appearing. Normal, full affect.  R elbow Ecchymosis or edema: neg ROM: full flexion, extension, pronation, supination Shoulder ROM: Full Flexion: 5/5 Extension: 5/5, PAINFUL Supination: 5/5, PAINFUL Pronation: 5/5 Wrist ext: 5/5 Wrist flexion: 5/5 No gross bony abnormality Varus and Valgus stress: stable ECRB tenderness: YES, TTP Medial epicondyle: NT Lateral epicondyle, resisted wrist extension from wrist full pronation and flexion: PAINFUL grip: 5/5  sensation intact Tinel's, Elbow:  negative  Subjective:   Right lateral epicondylitis  >25 minutes spent in face to face time with patient, >50% spent in counselling or coordination of care   Elbow anatomy was reviewed, and tendinopathy was explained.  Basic stretching and rehab reviewed Use counterforce strap if working or using hands.  Formal PT would be beneficial if delayed progression Emphasized stretching an cross-friction massage Emphasized proper palms up lifting biomechanics to unload ECRB  Change in job activities right now  Patient Instructions  Naproxen 500 mg 1 by mouth twice a day for 2-3 weeks scheduled  Voltaren 4 times a day or as you can remember to do it.  Icing the elbow at least once a day is a good idea.  Stretching is attached.    Follow-up: No follow-ups on file.  Signed,  Maud Deed. Avrianna Smart, MD  Patient's Medications  New Prescriptions   No medications on file  Previous Medications   AMBULATORY NON FORMULARY MEDICATION    Medication Name: 0.125% Nitroglycerin gel 1 pea sized amount per rectum three times a day   CABERGOLINE (DOSTINEX) 0.5 MG TABLET    Take 0.25 mg by mouth 2 (two) times a week. Mondays and Thursdays    DULOXETINE (CYMBALTA) 30 MG CAPSULE    Take 30 mg by mouth daily.     FLUTICASONE (FLONASE) 50 MCG/ACT NASAL SPRAY    Place into both nostrils daily.   LEVOCETIRIZINE (XYZAL) 5 MG TABLET    Take 5 mg by mouth every evening.   LEVOTHYROXINE (SYNTHROID, LEVOTHROID) 175 MCG TABLET    Take 175 mcg by mouth daily.     LINACLOTIDE (LINZESS) 290 MCG CAPS CAPSULE    Take 290 mcg by mouth daily before breakfast.   METFORMIN (GLUCOPHAGE) 500 MG TABLET    Take 1,000 mg by mouth 2 (two) times daily with a meal. One in the am and two in pm    ROSUVASTATIN (CRESTOR) 10 MG TABLET    Take 10 mg by mouth 2 (two) times a week. Mondays and Thursdays    TOLTERODINE (DETROL LA) 4 MG 24 HR CAPSULE    Take 4 mg by mouth daily.   TOPIRAMATE (TOPAMAX) 25 MG CAPSULE    Take 50 mg by  mouth 2 (two) times daily.     VERAPAMIL (CALAN-SR) 240 MG CR TABLET    Take 480 mg by mouth at bedtime.     VITAMIN D, ERGOCALCIFEROL, (DRISDOL) 50000 UNITS CAPS CAPSULE    Take 5,000 Units by mouth once a week.  Modified Medications   No medications on file  Discontinued Medications   No medications on file

## 2018-10-09 NOTE — Patient Instructions (Signed)
Naproxen 500 mg 1 by mouth twice a day for 2-3 weeks scheduled  Voltaren 4 times a day or as you can remember to do it.  Icing the elbow at least once a day is a good idea.  Stretching is attached.

## 2018-12-05 ENCOUNTER — Emergency Department
Admission: EM | Admit: 2018-12-05 | Discharge: 2018-12-05 | Disposition: A | Discharge status: Home / Self Care | Payer: Medicaid Other | Attending: Emergency Medicine | Admitting: Emergency Medicine

## 2018-12-05 ENCOUNTER — Emergency Department: Payer: Medicaid Other

## 2018-12-05 ENCOUNTER — Encounter: Payer: Self-pay | Admitting: *Deleted

## 2018-12-05 ENCOUNTER — Other Ambulatory Visit: Payer: Self-pay

## 2018-12-05 DIAGNOSIS — Z7984 Long term (current) use of oral hypoglycemic drugs: Secondary | ICD-10-CM | POA: Diagnosis not present

## 2018-12-05 DIAGNOSIS — Z79899 Other long term (current) drug therapy: Secondary | ICD-10-CM | POA: Insufficient documentation

## 2018-12-05 DIAGNOSIS — M25512 Pain in left shoulder: Secondary | ICD-10-CM | POA: Diagnosis present

## 2018-12-05 DIAGNOSIS — M7542 Impingement syndrome of left shoulder: Secondary | ICD-10-CM | POA: Diagnosis not present

## 2018-12-05 DIAGNOSIS — Z87820 Personal history of traumatic brain injury: Secondary | ICD-10-CM | POA: Diagnosis not present

## 2018-12-05 MED ORDER — TRAMADOL HCL 50 MG PO TABS
50.0000 mg | ORAL_TABLET | Freq: Once | ORAL | Status: AC
  Administered 2018-12-05: 20:00:00 50 mg via ORAL
  Filled 2018-12-05: qty 1

## 2018-12-05 MED ORDER — TRAMADOL HCL 50 MG PO TABS: 50.0000 mg | ORAL_TABLET | Freq: Four times a day (QID) | ORAL | 0 refills | Status: DC | PRN

## 2018-12-05 MED ORDER — MELOXICAM 15 MG PO TABS: 15.0000 mg | ORAL_TABLET | Freq: Every day | ORAL | 0 refills | Status: DC

## 2018-12-05 NOTE — ED Notes (Signed)
E-signature not working at this time. Pt and caregiver at bedside verbalizing understanding of D/C instructions and prescriptions. No further questions at this time. Pt in NAD at time of D/C.

## 2018-12-05 NOTE — ED Provider Notes (Signed)
[facility] Emergency Department Provider Note  ____________________________________________  Time seen: Approximately 7:24 PM  I have reviewed the triage vital signs and the nursing notes.   HISTORY  Chief Complaint Shoulder Pain  Patient has a history of traumatic brain injury.  43 of history is provided by mother.  Patient has short-term memory loss and is not able to remember events that occurred even earlier today.  She is able to answer questions appropriately regarding symptoms now.  HPI Christine Koch is a 43 y.o. female who presents the emergency department complaining of nontraumatic left shoulder pain.  Per the mother, patient has a history of traumatic brain injury with shoes.  Patient awoke her this morning complaining of left shoulder pain.  Patient was able to go back to sleep taking naproxen.  Patient has a Actuary throughout the day and according to mother when she returned from work the sitter mentioned that the patient has been complaining of ongoing left shoulder pain throughout the day.  Mother reports that if this had a been a improving issue, patient would not have remembered her shoulder pain and continued to discuss it.  Patient is currently using Naprosyn for tennis elbow but this medicine does not appear to have alleviated the left shoulder pain.  According to the mother the patient has not complained of any other symptoms.  No recent trauma.  Mother reports that the patient was laying on the couch for long period of time yesterday with her arm/shoulder at an odd angle.  Patient has had no fevers or chills, cough, shortness of breath, reported chest pain.  Patient is able to answer questions regarding symptoms currently but does not remember symptoms throughout the day or previous injury due to traumatic brain injury.  Patient reports that it is a aching/stabbing sensation to the left anterior shoulder.  She denies any chest pain or shortness  of breath.  She denies any neck pain.  She denies any numbness or tingling in the left hand.         Past Medical History:  Diagnosis Date  . 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 Depression   . Dermatophytosis of nail   . Diabetes ([place])   . H/O: pneumonia   . Headache   . Impacted cerumen   . Internal hemorrhoids without mention of complication   . Other acute otitis externa   . Thyroid disease   . Unspecified cerebral artery occlusion with cerebral infarction 1995    Patient Active Problem List   Diagnosis Date Noted  . Nocturia 10/30/2012  . Urgency incontinence 10/30/2012  . 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 Injury brain, traumatic ([place]) 08/10/2011  . Migraine 08/10/2011  . HEMORRHOIDS, INTERNAL 06/15/2010  . ONYCHOMYCOSIS, TOENAILS 05/07/2007  . 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 CEREBROVASCULAR ACCIDENT 11/22/2006  . PNEUMONIA, HX OF 11/22/2006    Past Surgical History:  Procedure Laterality Date  . ADENOIDECTOMY  1979  . MYRINGOTOMY  1979  . NEUROENDOSCOPIC PLACEMENT / REPLACEMENT VENTRICULAR CATHETER W/ ATTACHMENT SHUNT / EXTERNAL DRAIN  1995   for hydrocephaly  . TONSILLECTOMY  1993  . TUMOR REMOVAL  1995   Pituitary tumor    Prior to Admission medications   Medication Sig Start Date End Date Taking? Authorizing Provider  AMBULATORY NON FORMULARY MEDICATION Medication Name: 0.125% Nitroglycerin gel 1 pea sized amount per rectum three times a day Patient taking differently: as needed. Medication Name: 0.125% Nitroglycerin gel  1 pea sized amount per rectum three times a day 10/22/17   Mauri Pole, MD  cabergoline (DOSTINEX) 0.5 MG tablet Take 0.25 mg by mouth 2 (two) times a week. Mondays and Thursdays     [provider]  DULoxetine (CYMBALTA) 30 MG capsule Take 30 mg by mouth daily.      [provider]  fluticasone (FLONASE) 50 MCG/ACT nasal spray Place into both nostrils daily.     [provider]  levocetirizine (XYZAL) 5 MG tablet Take 5 mg by mouth every evening.    [provider]  levothyroxine (SYNTHROID, LEVOTHROID) 175 MCG tablet Take 175 mcg by mouth daily.      [provider]  linaclotide (LINZESS) 290 MCG CAPS capsule Take 290 mcg by mouth daily before breakfast.    [provider]  meloxicam (MOBIC) 15 MG tablet Take 1 tablet (15 mg total) by mouth daily. 12/05/18   Afsana Liera, Charline Bills, PA-C  metFORMIN (GLUCOPHAGE) 500 MG tablet Take 1,000 mg by mouth 2 (two) times daily with a meal. One in the am and two in pm     [provider]  rosuvastatin (CRESTOR) 10 MG tablet Take 10 mg by mouth 2 (two) times a week. Mondays and Thursdays     [provider]  tolterodine (DETROL LA) 4 MG 24 hr capsule Take 4 mg by mouth daily.    [provider]  topiramate (TOPAMAX) 25 MG capsule Take 50 mg by mouth 2 (two) times daily.      [provider]  traMADol (ULTRAM) 50 MG tablet Take 1 tablet (50 mg total) by mouth every 6 (six) hours as needed. 12/05/18   Ilisa Hayworth, Charline Bills, PA-C  verapamil (CALAN-SR) 240 MG CR tablet Take 480 mg by mouth at bedtime.      [provider]  Vitamin D, Ergocalciferol, (DRISDOL) 50000 units CAPS capsule Take 5,000 Units by mouth once a week. 08/18/16   [provider]    Allergies Codeine  Family History  Problem Relation Age of Onset  . Diabetes Mother   . Hypertension Mother   . Colon polyps Mother   . Diabetes Maternal Aunt   . Diabetes Maternal Grandmother   . Hypertension Maternal Grandmother   . Heart disease Maternal Grandmother   . Cancer Maternal Grandfather        unknown type? possibly lung cancer  . Diabetes Paternal Grandmother   . Colon cancer Neg Hx   . Stomach cancer Neg Hx     Social History Social History   Tobacco Use  . Smoking status: Never Smoker  . Smokeless tobacco: Never Used  Substance Use Topics  . Alcohol  use: Not Currently    Alcohol/week: 0.0 standard drinks  . Drug use: Not Currently     Review of Systems  Constitutional: No fever/chills Eyes: No visual changes. No discharge ENT: No upper respiratory complaints. Cardiovascular: no chest pain. Respiratory: no cough. No SOB. Gastrointestinal: No abdominal pain.  No nausea, no vomiting.   Musculoskeletal: Positive for left shoulder pain Skin: Negative for rash, abrasions, lacerations, ecchymosis. Neurological: Negative for headaches, focal weakness or numbness. 10-point ROS otherwise negative.  ____________________________________________   PHYSICAL EXAM:  VITAL SIGNS: ED Triage Vitals  Enc Vitals Group     BP 12/05/18 1917 112/63     Pulse Rate 12/05/18 1917 61     Resp 12/05/18 1914 20     Temp 12/05/18 1914 97.8 F (36.6 C)  Temp Source 12/05/18 1914 Oral     SpO2 12/05/18 1917 99 %     Weight 12/05/18 1915 265 lb (120.2 kg)     Height 12/05/18 1915 5\' 10"  (1.778 m)     Head Circumference --      Peak Flow --      Pain Score 12/05/18 1915 8     Pain Loc --      Pain Edu? --      Excl. in [place]? --      Constitutional: Alert and oriented. Well appearing and in no acute distress. Eyes: Conjunctivae are normal. PERRL. EOMI. Head: Atraumatic. ENT:      Ears:       Nose: No congestion/rhinnorhea.      Mouth/Throat: Mucous membranes are moist.  Neck: No stridor.  No cervical spine tenderness to palpation.  Cardiovascular: Normal rate, regular rhythm. Normal S1 and S2.  Good peripheral circulation. Respiratory: Normal respiratory effort without tachypnea or retractions. Lungs CTAB. Good air entry to the bases with no decreased or absent breath sounds. Musculoskeletal: Full range of motion to all extremities. No gross deformities appreciated.  Visualization of the left shoulder reveals no gross patient is very tender to palpation of the acromioclavicular joint space with no other palpable tenderness.  No palpable  abnormality to this area.  Patient is able to move the shoulder in all planes appropriately with no loss range of motion.  Radial pulse intact distally.  Sensation intact distally.  Examination of the cervical spine, left elbow, left wrist and hand is unremarkable. Neurologic:  Normal speech and language. No gross focal neurologic deficits are appreciated.  Skin:  Skin is warm, dry and intact. No rash noted. Psychiatric: Mood and affect are normal. Speech and behavior are normal. Patient exhibits appropriate insight and judgement.   ____________________________________________   LABS (all labs ordered are listed, but only abnormal results are displayed)  Labs Reviewed - No data to display ____________________________________________  EKG   ____________________________________________  RADIOLOGY I personally viewed and evaluated these images as part of my medical decision making, as well as reviewing the written report by the radiologist.  I concur with radiologist finding of no acute osseous abnormality to the left shoulder.  Dg Shoulder Left  Result Date: 12/05/2018 CLINICAL DATA:  Pain EXAM: LEFT SHOULDER - 2+ VIEW COMPARISON:  None. FINDINGS: Oblique, Y scapular, and axillary images were obtained. There is no fracture or dislocation. Joint spaces appear normal. No erosive change. IMPRESSION: No fracture or dislocation.  No evident arthropathy. Electronically Signed   By: Lowella Grip III M.D.   On: 12/05/2018 20:02    ____________________________________________    PROCEDURES  Procedure(s) performed:    Procedures    Medications  traMADol (ULTRAM) tablet 50 mg (50 mg Oral Given 12/05/18 1944)     ____________________________________________   INITIAL IMPRESSION / ASSESSMENT AND PLAN / ED COURSE  Pertinent labs & imaging results that were available during my care of the patient were reviewed by me and considered in my medical decision making (see chart for  details).  Review of the [place] CSRS was performed in accordance of the [place] prior to dispensing any controlled drugs.           Patient's diagnosis is consistent with impingement of the left shoulder.  Patient presented to the emergency department with sharp pain to the left anterior shoulder.  No significant traumatic injury.  According to the mother, the patient had been laying on the shoulder  in an awkward position most of the day yesterday.  I suspect that this is the main reason for patient's continued shoulder pain.  Patient does have a history of traumatic brain injury and was unable to fill in previous history but is able to answer appropriate questions regarding symptoms currently.  Overall, exam is reassuring.  Imaging reveals no acute osseous abnormality.. Patient will be discharged home with prescriptions for meloxicam and short course of Ultram for pain. Patient is to follow up with primary care or orthopedics as needed or otherwise directed. Patient is given ED precautions to return to the ED for any worsening or new symptoms.     ____________________________________________  FINAL CLINICAL IMPRESSION(S) / ED DIAGNOSES  Final diagnoses:  Impingement syndrome of left shoulder      NEW MEDICATIONS STARTED DURING THIS VISIT:  ED Discharge Orders         Ordered    meloxicam (MOBIC) 15 MG tablet  Daily     12/05/18 2012    traMADol (ULTRAM) 50 MG tablet  Every 6 hours PRN     12/05/18 2012              This chart was dictated using voice recognition software/Dragon. Despite best efforts to proofread, errors can occur which can change the meaning. Any change was purely unintentional.    Darletta Moll, PA-C 12/05/18 2014    Nance Pear, MD 12/07/18 [phone]

## 2018-12-05 NOTE — ED Triage Notes (Addendum)
Pt woke up with pain in left shoulder and upper arm  Pt taking naproxen without pain relief today.  No known injury.  Pt alert.

## 2018-12-05 NOTE — ED Notes (Signed)
Patient transported to X-ray 

## 2018-12-26 ENCOUNTER — Other Ambulatory Visit: Payer: Self-pay | Admitting: Obstetrics & Gynecology

## 2018-12-26 DIAGNOSIS — Z1231 Encounter for screening mammogram for malignant neoplasm of breast: Secondary | ICD-10-CM

## 2019-02-26 ENCOUNTER — Other Ambulatory Visit: Payer: Self-pay

## 2019-02-26 ENCOUNTER — Ambulatory Visit
Admission: RE | Admit: 2019-02-26 | Discharge: 2019-02-26 | Disposition: A | Discharge status: Home / Self Care | Payer: Medicaid Other | Source: Ambulatory Visit | Attending: Obstetrics & Gynecology | Admitting: Obstetrics & Gynecology

## 2019-02-26 DIAGNOSIS — Z1231 Encounter for screening mammogram for malignant neoplasm of breast: Secondary | ICD-10-CM

## 2019-03-14 ENCOUNTER — Other Ambulatory Visit: Payer: Self-pay

## 2019-03-14 ENCOUNTER — Encounter: Payer: Self-pay | Admitting: Family Medicine

## 2019-03-14 ENCOUNTER — Ambulatory Visit (INDEPENDENT_AMBULATORY_CARE_PROVIDER_SITE_OTHER)
Admission: RE | Admit: 2019-03-14 | Discharge: 2019-03-14 | Disposition: A | Discharge status: Home / Self Care | Payer: Medicaid Other | Source: Ambulatory Visit | Attending: Family Medicine | Admitting: Family Medicine

## 2019-03-14 ENCOUNTER — Ambulatory Visit (INDEPENDENT_AMBULATORY_CARE_PROVIDER_SITE_OTHER): Payer: Medicaid Other | Admitting: Family Medicine

## 2019-03-14 VITALS — BP 110/68 | HR 76 | Temp 98.0°F

## 2019-03-14 DIAGNOSIS — M25561 Pain in right knee: Secondary | ICD-10-CM

## 2019-03-14 DIAGNOSIS — M79641 Pain in right hand: Secondary | ICD-10-CM

## 2019-03-14 NOTE — Assessment & Plan Note (Signed)
After a fall directly on patella  For abrasion- soap/water loose cover and abx ointm prn     utd tetanus  In light of tenderness/swelling -xray ordered  Pending rad rev  Ice/elevation and relative rest for now  Suspect strain/sprain most likely

## 2019-03-14 NOTE — Patient Instructions (Signed)
xrays now  Plan to follow  Soap /water/ loose covering on knee   Ice  Rest and elevation

## 2019-03-14 NOTE — Assessment & Plan Note (Signed)
Acute-after a fall this am  Suspect sprain Lynne Logan re: 4th finger  Recommend ice/ relative rest Would not splint unless fracture (since no bruising or sweling)  Xray now pend rad rev (I see no fractures grossly)

## 2019-03-14 NOTE — Progress Notes (Signed)
Subjective:    Patient ID: Christine Koch, female    DOB: 04-10-1976, 43 y.o.   MRN: [id number]  HPI Here for injuries after fall at 9 am on concrete at the [employer] on/off sidewalk  She is a high fall risk   R knee pain  Pain across whole knee  [place] directly on her knee  Landed squarely on the patella   utd tdap There is a scrape   R hand injury  4th finger  Hurts more to bend it tightly  Some swelling  Not bruised    Using ice and not walking   Patient Active Problem List   Diagnosis Date Noted  . Right knee pain 03/14/2019  . Right hand pain 03/14/2019  . Nocturia 10/30/2012  . Urgency incontinence 10/30/2012  . 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 Injury brain, traumatic ([place]) 08/10/2011  . Migraine 08/10/2011  . HEMORRHOIDS, INTERNAL 06/15/2010  . ONYCHOMYCOSIS, TOENAILS 05/07/2007  . 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 CEREBROVASCULAR ACCIDENT 11/22/2006  . PNEUMONIA, HX OF 11/22/2006   Past Medical History:  Diagnosis Date  . 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 Depression   . Dermatophytosis of nail   . Diabetes ([place])   . H/O: pneumonia   . Headache   . Impacted cerumen   . Internal hemorrhoids without mention of complication   . Other acute otitis externa   . Thyroid disease   . Unspecified cerebral artery occlusion with cerebral infarction 1995   Past Surgical History:  Procedure Laterality Date  . ADENOIDECTOMY  1979  . MYRINGOTOMY  1979  . NEUROENDOSCOPIC PLACEMENT / REPLACEMENT VENTRICULAR CATHETER W/ ATTACHMENT SHUNT / EXTERNAL DRAIN  1995   for hydrocephaly  . TONSILLECTOMY  1993  . TUMOR REMOVAL  1995   Pituitary tumor   Social History   Tobacco Use  . Smoking status: Never Smoker  . Smokeless tobacco: Never Used  Substance Use Topics  . Alcohol use: Not Currently    Alcohol/week: 0.0 standard drinks  . Drug use: Not Currently   Family History  Problem Relation Age of Onset  .  Diabetes Mother   . Hypertension Mother   . Colon polyps Mother   . Diabetes Maternal Aunt   . Diabetes Maternal Grandmother   . Hypertension Maternal Grandmother   . Heart disease Maternal Grandmother   . Cancer Maternal Grandfather        unknown type? possibly lung cancer  . Diabetes Paternal Grandmother   . Colon cancer Neg Hx   . Stomach cancer Neg Hx    Allergies  Allergen Reactions  . Codeine     REACTION: Vomiting   Current Outpatient Medications on File Prior to Visit  Medication Sig Dispense Refill  . AMBULATORY NON FORMULARY MEDICATION Medication Name: 0.125% Nitroglycerin gel 1 pea sized amount per rectum three times a day (Patient taking differently: as needed. Medication Name: 0.125% Nitroglycerin gel 1 pea sized amount per rectum three times a day) 30 g 1  . cabergoline (DOSTINEX) 0.5 MG tablet Take 0.25 mg by mouth 2 (two) times a week. Mondays and Thursdays     . DULoxetine (CYMBALTA) 30 MG capsule Take 30 mg by mouth daily.      . fluticasone (FLONASE) 50 MCG/ACT nasal spray Place into both nostrils daily as needed.     Marland Kitchen levocetirizine (XYZAL) 5 MG tablet Take 5 mg by mouth daily as needed.     Marland Kitchen  levothyroxine (SYNTHROID, LEVOTHROID) 175 MCG tablet Take 175 mcg by mouth daily.      Marland Kitchen linaclotide (LINZESS) 290 MCG CAPS capsule Take 290 mcg by mouth daily before breakfast.    . metFORMIN (GLUCOPHAGE) 500 MG tablet Take 1,000 mg by mouth 2 (two) times daily with a meal. One in the am and two in pm     . rosuvastatin (CRESTOR) 10 MG tablet Take 10 mg by mouth 2 (two) times a week. Mondays and Thursdays     . tolterodine (DETROL LA) 4 MG 24 hr capsule Take 4 mg by mouth daily.    Marland Kitchen topiramate (TOPAMAX) 25 MG capsule Take 50 mg by mouth 2 (two) times daily.      . verapamil (CALAN-SR) 240 MG CR tablet Take 480 mg by mouth at bedtime.      . Vitamin D, Ergocalciferol, (DRISDOL) 50000 units CAPS capsule Take 5,000 Units by mouth once a week.  6   No current  facility-administered medications on file prior to visit.     Review of Systems  Constitutional: Negative for activity change, appetite change, fatigue, fever and unexpected weight change.  HENT: Negative for congestion, ear pain, rhinorrhea, sinus pressure and sore throat.   Eyes: Negative for pain, redness and visual disturbance.  Respiratory: Negative for cough, shortness of breath and wheezing.   Cardiovascular: Negative for chest pain and palpitations.  Gastrointestinal: Negative for abdominal pain, blood in stool, constipation and diarrhea.  Endocrine: Negative for polydipsia and polyuria.  Genitourinary: Negative for dysuria, frequency and urgency.  Musculoskeletal: Negative for arthralgias, back pain, myalgias, neck pain and neck stiffness.       Injury with pain in R knee and hand  Skin: Negative for pallor and rash.       Abrasion R knee  Allergic/Immunologic: Negative for environmental allergies.  Neurological: Negative for dizziness, syncope and headaches.       Baseline neuro def due to past brain injury/cva   Weaker on R side /easier to fall due to this and poor balance  Hematological: Negative for adenopathy. Does not bruise/bleed easily.  Psychiatric/Behavioral: Negative for decreased concentration and dysphoric mood. The patient is not nervous/anxious.        Objective:   Physical Exam Constitutional:      Appearance: Normal appearance. She is obese. She is not ill-appearing or diaphoretic.     Comments: In wheelchair  HENT:     Head: Normocephalic and atraumatic.  Eyes:     Conjunctiva/sclera: Conjunctivae normal.     Pupils: Pupils are equal, round, and reactive to light.  Neck:     Musculoskeletal: Normal range of motion and neck supple.  Cardiovascular:     Rate and Rhythm: Normal rate.  Pulmonary:     Effort: Pulmonary effort is normal. No respiratory distress.  Musculoskeletal:     Right knee: She exhibits decreased range of motion, swelling and bony  tenderness. She exhibits no effusion, no ecchymosis, no deformity, no erythema, no LCL laxity, normal patellar mobility and normal meniscus. Tenderness found.     Right hand: She exhibits tenderness and bony tenderness. She exhibits normal range of motion, no deformity, no laceration and no swelling. Normal sensation noted. Normal strength noted.     Right lower leg: No edema.     Left lower leg: No edema.     Comments: Tender over R patella with small abrasion  No effusion  Some swelling over patella No joint line pain No crepitus  Pain  to flex knee  No instability   R hand-some tenderness of 4th proximal phalanx but able to move it well No swelling or bruising  Pain with full grip only   Skin:    Findings: No bruising, erythema or rash.     Comments: Clean abrasion on R patella  Neurological:     Mental Status: She is alert. Mental status is at baseline.  Psychiatric:        Mood and Affect: Mood normal.     Comments: Not in distress           Assessment & Plan:   Problem List Items Addressed This Visit      Other   Right knee pain - Primary    After a fall directly on patella  For abrasion- soap/water loose cover and abx ointm prn     utd tetanus  In light of tenderness/swelling -xray ordered  Pending rad rev  Ice/elevation and relative rest for now  Suspect strain/sprain most likely      Relevant Orders   DG Knee 3 Views Right   Right hand pain    Acute-after a fall this am  Suspect sprain Lynne Logan re: 4th finger  Recommend ice/ relative rest Would not splint unless fracture (since no bruising or sweling)  Xray now pend rad rev (I see no fractures grossly)      Relevant Orders   DG Hand Complete Right

## 2019-08-06 ENCOUNTER — Other Ambulatory Visit: Payer: Self-pay

## 2019-08-07 ENCOUNTER — Ambulatory Visit (INDEPENDENT_AMBULATORY_CARE_PROVIDER_SITE_OTHER): Payer: Medicaid Other | Admitting: Obstetrics & Gynecology

## 2019-08-07 ENCOUNTER — Encounter: Payer: Self-pay | Admitting: Obstetrics & Gynecology

## 2019-08-07 VITALS — BP 124/86 | Ht 66.0 in | Wt 261.0 lb

## 2019-08-07 DIAGNOSIS — Z01419 Encounter for gynecological examination (general) (routine) without abnormal findings: Secondary | ICD-10-CM

## 2019-08-07 DIAGNOSIS — Z6841 Body Mass Index (BMI) 40.0 and over, adult: Secondary | ICD-10-CM

## 2019-08-07 DIAGNOSIS — Z0000 Encounter for general adult medical examination without abnormal findings: Secondary | ICD-10-CM

## 2019-08-07 DIAGNOSIS — Z3009 Encounter for other general counseling and advice on contraception: Secondary | ICD-10-CM

## 2019-08-07 NOTE — Progress Notes (Signed)
Christine Koch Apr 28, 1976 [id number]   History:    43 y.o. G0 Single.  Presented with her mother  RP:  Established patient presenting for annual gyn exam   HPI: H/O Head trauma/Hydrocephaly.  Followed by Dr Forde Dandy for Hypothyroidism/Hyperprolactinemia/DM type 2.  Menstrual periods regular every month.  No pelvic pain.  Normal vaginal secretions. Abstinent. Treated for urinary urgency, on Myrbetriq now.  Bowel movements normal.  Breasts normal.  Body mass index increased to 42.13.    Past medical history,surgical history, family history and social history were all reviewed and documented in the EPIC chart.  Gynecologic History Patient's last menstrual period was 08/05/2019.  Obstetric History OB History  Gravida Para Term Preterm AB Living  0 0 0 0 0 0  SAB TAB Ectopic Multiple Live Births  0 0 0 0 0     ROS: A ROS was performed and pertinent positives and negatives are included in the history.  GENERAL: No fevers or chills. HEENT: No change in vision, no earache, sore throat or sinus congestion. NECK: No pain or stiffness. CARDIOVASCULAR: No chest pain or pressure. No palpitations. PULMONARY: No shortness of breath, cough or wheeze. GASTROINTESTINAL: No abdominal pain, nausea, vomiting or diarrhea, melena or bright red blood per rectum. GENITOURINARY: No urinary frequency, urgency, hesitancy or dysuria. MUSCULOSKELETAL: No joint or muscle pain, no back pain, no recent trauma. DERMATOLOGIC: No rash, no itching, no lesions. ENDOCRINE: No polyuria, polydipsia, no heat or cold intolerance. No recent change in weight. HEMATOLOGICAL: No anemia or easy bruising or bleeding. NEUROLOGIC: No headache, seizures, numbness, tingling or weakness. PSYCHIATRIC: No depression, no loss of interest in normal activity or change in sleep pattern.     Exam:   BP 124/86   Ht 5\' 6"  (1.676 m)   Wt 261 lb (118.4 kg)   LMP 08/05/2019   BMI 42.13 kg/m   Body mass index is 42.13 kg/m.  General  appearance : Well developed well nourished female. No acute distress HEENT: Eyes: no retinal hemorrhage or exudates,  Neck supple, trachea midline, no carotid bruits, no thyroidmegaly Lungs: Clear to auscultation, no rhonchi or wheezes, or rib retractions  Heart: Regular rate and rhythm, no murmurs or gallops Breast:Examined in sitting and supine position were symmetrical in appearance, no palpable masses or tenderness,  no skin retraction, no nipple inversion, no nipple discharge, no skin discoloration, no axillary or supraclavicular lymphadenopathy Abdomen: no palpable masses or tenderness, no rebound or guarding Extremities: no edema or skin discoloration or tenderness  Pelvic: Vulva: Normal             Vagina: No gross lesions or discharge  Cervix: No gross lesions or discharge  Uterus  AV, normal size, shape and consistency, non-tender and mobile  Adnexa  Without masses or tenderness  Anus: Normal   Assessment/Plan:  43 y.o. female for annual exam   1. Well female exam with routine gynecological exam Normal gynecologic exam.  Pap test - April 2019, no indication to to repeat this year.  Breast exam normal.  Screening mammogram July 2020 was negative.  Health labs with Dr. Forde Dandy.  2. Encounter for other general counseling or advice on contraception Currently abstinent.  3. Class 3 severe obesity due to excess calories without serious comorbidity with body mass index (BMI) of 40.0 to 44.9 in adult [facility]) Recommend a lower calorie/carb diet.  Aerobic physical activities 5 times a week and light weightlifting every 2 days recommended.  Other orders - mirabegron ER (  MYRBETRIQ) 25 MG TB24 tablet; Take 25 mg by mouth daily.  Princess Bruins MD, 3:16 PM 08/07/2019

## 2019-08-10 ENCOUNTER — Encounter: Payer: Self-pay | Admitting: Obstetrics & Gynecology

## 2019-08-10 NOTE — Patient Instructions (Signed)
1. Well female exam with routine gynecological exam Normal gynecologic exam.  Pap test - April 2019, no indication to to repeat this year.  Breast exam normal.  Screening mammogram July 2020 was negative.  Health labs with Dr. Forde Dandy.  2. Encounter for other general counseling or advice on contraception Currently abstinent.  3. Class 3 severe obesity due to excess calories without serious comorbidity with body mass index (BMI) of 40.0 to 44.9 in adult [facility]) Recommend a lower calorie/carb diet.  Aerobic physical activities 5 times a week and light weightlifting every 2 days recommended.  Other orders - mirabegron ER (MYRBETRIQ) 25 MG TB24 tablet; Take 25 mg by mouth daily.  Christine Koch, it was a pleasure seeing you today!

## 2019-09-22 ENCOUNTER — Ambulatory Visit: Payer: Medicaid Other | Attending: Internal Medicine

## 2019-09-22 DIAGNOSIS — Z20822 Contact with and (suspected) exposure to covid-19: Secondary | ICD-10-CM

## 2019-09-23 LAB — NOVEL CORONAVIRUS, NAA: SARS-CoV-2, NAA: NOT DETECTED

## 2019-11-01 ENCOUNTER — Ambulatory Visit: Payer: Medicaid Other | Attending: Internal Medicine

## 2019-11-01 DIAGNOSIS — Z23 Encounter for immunization: Secondary | ICD-10-CM | POA: Insufficient documentation

## 2019-11-01 NOTE — Progress Notes (Signed)
   Covid-19 Vaccination Clinic  Name:  Christine Koch    MRN: [id number] DOB: [birth year]  11/01/2019  Ms. Hanan was observed post Covid-19 immunization for 15 minutes without incident. She was provided with Vaccine Information Sheet and instruction to access the V-Safe system.   Ms. Wherley was instructed to call 911 with any severe reactions post vaccine: Marland Kitchen Difficulty breathing  . Swelling of face and throat  . A fast heartbeat  . A bad rash all over body  . Dizziness and weakness   Immunizations Administered    Name Date Dose VIS Date Route   Pfizer COVID-19 Vaccine 11/01/2019 12:27 PM 0.3 mL 08/08/2019 Intramuscular   Manufacturer: [place]   Lot: [id number]   [place]: [id number]

## 2019-11-11 ENCOUNTER — Ambulatory Visit (INDEPENDENT_AMBULATORY_CARE_PROVIDER_SITE_OTHER): Payer: Medicaid Other | Admitting: Internal Medicine

## 2019-11-11 ENCOUNTER — Encounter: Payer: Self-pay | Admitting: Internal Medicine

## 2019-11-11 ENCOUNTER — Other Ambulatory Visit: Payer: Self-pay

## 2019-11-11 VITALS — BP 118/76 | HR 60 | Temp 97.0°F | Wt 258.0 lb

## 2019-11-11 DIAGNOSIS — N907 Vulvar cyst: Secondary | ICD-10-CM

## 2019-11-11 NOTE — Progress Notes (Signed)
Subjective:    Patient ID: Christine Koch, female    DOB: 01/31/1976, 44 y.o.   MRN: [id number]  HPI  Pt presents to the clinic today with c/o a lump near her vagina. She noticed this 1 week ago. It has gotten smaller in size.  She denies redness, tenderness or drainage from the area.  She has not tried anything OTC for this.  Review of Systems  Past Medical History:  Diagnosis Date  . 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 Depression   . Dermatophytosis of nail   . Diabetes ([place])   . H/O: pneumonia   . Headache   . Impacted cerumen   . Internal hemorrhoids without mention of complication   . Other acute otitis externa   . Thyroid disease   . Unspecified cerebral artery occlusion with cerebral infarction 1995    Current Outpatient Medications  Medication Sig Dispense Refill  . AMBULATORY NON FORMULARY MEDICATION Medication Name: 0.125% Nitroglycerin gel 1 pea sized amount per rectum three times a day (Patient taking differently: as needed. Medication Name: 0.125% Nitroglycerin gel 1 pea sized amount per rectum three times a day) 30 g 1  . cabergoline (DOSTINEX) 0.5 MG tablet Take 0.25 mg by mouth 2 (two) times a week. Mondays and Thursdays     . DULoxetine (CYMBALTA) 30 MG capsule Take 30 mg by mouth daily.      . fluticasone (FLONASE) 50 MCG/ACT nasal spray Place into both nostrils daily as needed.     Marland Kitchen levocetirizine (XYZAL) 5 MG tablet Take 5 mg by mouth daily as needed.     Marland Kitchen levothyroxine (SYNTHROID, LEVOTHROID) 175 MCG tablet Take 175 mcg by mouth daily.      Marland Kitchen linaclotide (LINZESS) 290 MCG CAPS capsule Take 290 mcg by mouth daily before breakfast.    . metFORMIN (GLUCOPHAGE) 500 MG tablet Take 1,000 mg by mouth 2 (two) times daily with a meal. One in the am and two in pm     . mirabegron ER (MYRBETRIQ) 25 MG TB24 tablet Take 25 mg by mouth daily.    . rosuvastatin (CRESTOR) 10 MG tablet Take 10 mg by mouth 2 (two) times a week.  Mondays and Thursdays     . tolterodine (DETROL LA) 4 MG 24 hr capsule Take 4 mg by mouth daily.    Marland Kitchen topiramate (TOPAMAX) 25 MG capsule Take 50 mg by mouth 2 (two) times daily.      . verapamil (CALAN-SR) 240 MG CR tablet Take 480 mg by mouth at bedtime.      . Vitamin D, Ergocalciferol, (DRISDOL) 50000 units CAPS capsule Take 5,000 Units by mouth once a week.  6   No current facility-administered medications for this visit.    Allergies  Allergen Reactions  . Codeine     REACTION: Vomiting    Family History  Problem Relation Age of Onset  . Diabetes Mother   . Hypertension Mother   . Colon polyps Mother   . Diabetes Maternal Aunt   . Diabetes Maternal Grandmother   . Hypertension Maternal Grandmother   . Heart disease Maternal Grandmother   . Cancer Maternal Grandfather        unknown type? possibly lung cancer  . Diabetes Paternal Grandmother   . Colon cancer Neg Hx   . Stomach cancer Neg Hx     Social History   Socioeconomic History  . Marital status: Single    Spouse  name: Not on file  . Number of children: Not on file  . Years of education: Not on file  . Highest education level: Not on file  Occupational History  . Not on file  Tobacco Use  . Smoking status: Never Smoker  . Smokeless tobacco: Never Used  Substance and Sexual Activity  . Alcohol use: Not Currently    Alcohol/week: 0.0 standard drinks  . Drug use: Not Currently  . Sexual activity: Not Currently    Comment: intercourse age 43 , less than 5 sexual partners  Other Topics Concern  . Not on file  Social History Narrative  . Not on file   Social Determinants of Health   Financial Resource Strain:   . Difficulty of Paying Living Expenses:   Food Insecurity:   . Worried About Charity fundraiser in the Last Year:   . Arboriculturist in the Last Year:   Transportation Needs:   . Film/video editor (Medical):   Marland Kitchen Lack of Transportation (Non-Medical):   Physical Activity:   . Days of  Exercise per Week:   . Minutes of Exercise per Session:   Stress:   . Feeling of Stress :   Social Connections:   . Frequency of Communication with Friends and Family:   . Frequency of Social Gatherings with Friends and Family:   . Attends Religious Services:   . Active Member of Clubs or Organizations:   . Attends Archivist Meetings:   Marland Kitchen Marital Status:   Intimate Partner Violence:   . Fear of Current or Ex-Partner:   . Emotionally Abused:   Marland Kitchen Physically Abused:   . Sexually Abused:      Constitutional: Denies fever, malaise, fatigue, headache or abrupt weight changes.  Respiratory: Denies difficulty breathing, shortness of breath, cough or sputum production.   Cardiovascular: Denies chest pain, chest tightness, palpitations or swelling in the hands or feet.  Skin: Patient reports lump near her vagina.  Denies redness, rashes or ulcercations.    No other specific complaints in a complete review of systems (except as listed in HPI above).     Objective:   Physical Exam  BP 118/76   Pulse 60   Temp (!) 97 F (36.1 C) (Temporal)   Wt 258 lb (117 kg)   SpO2 98%   BMI 41.64 kg/m   Wt Readings from Last 3 Encounters:  08/07/19 261 lb (118.4 kg)  12/05/18 265 lb (120.2 kg)  10/09/18 262 lb 12 oz (119.2 kg)    General: Appears her stated age, obese, in NAD. Skin: Warm, dry and intact.  Cyst noted at the top of the right labia. Cardiovascular: Normal rate and rhythm. S1,S2 noted.  No murmur, rubs or gallops noted.  Pulmonary/Chest: Normal effort and positive vesicular breath sounds. No respiratory distress. No wheezes, rales or ronchi noted.  Pelvic: Normal female anatomy.  No vaginal discharge noted. Neurological: Alert and oriented.  BMET    Component Value Date/Time   NA 139 03/07/2017 1109   K 3.7 03/07/2017 1109   CL 107 03/07/2017 1109   CO2 26 03/07/2017 1109   GLUCOSE 156 (H) 03/07/2017 1109   BUN 16 03/07/2017 1109   CREATININE 0.82 03/07/2017  1109   CALCIUM 9.3 03/07/2017 1109    Lipid Panel  No results found for: CHOL, TRIG, HDL, CHOLHDL, VLDL, LDLCALC  CBC    Component Value Date/Time   WBC 10.0 03/07/2017 1109   RBC 3.83 (L) 03/07/2017  1109   HGB 11.5 (L) 10/16/2017 1219   HCT 34.8 (L) 03/07/2017 1109   PLT 310.0 03/07/2017 1109   MCV 90.7 03/07/2017 1109   MCHC 34.0 03/07/2017 1109   RDW 13.1 03/07/2017 1109   LYMPHSABS 3.2 03/07/2017 1109   MONOABS 0.7 03/07/2017 1109   EOSABS 0.3 03/07/2017 1109   BASOSABS 0.0 03/07/2017 1109    Hgb A1C No results found for: HGBA1C          Assessment & Plan:  Labial Cyst:  No intervention at this time Monitor for increased size, redness, tenderness or discharge  RTC as needed or if symptoms persist or worsen  Webb Silversmith, NP This visit occurred during the SARS-CoV-2 public health emergency.  Safety protocols were in place, including screening questions prior to the visit, additional usage of staff PPE, and extensive cleaning of exam room while observing appropriate contact time as indicated for disinfecting solutions.

## 2019-11-12 ENCOUNTER — Encounter: Payer: Self-pay | Admitting: Internal Medicine

## 2019-11-12 NOTE — Patient Instructions (Signed)
Epidermal Cyst  An epidermal cyst is a small, painless lump under your skin. The cyst contains a grayish-white, bad-smelling substance (keratin). Do not try to pop or open an epidermal cyst yourself. What are the causes?  A blocked hair follicle.  A hair that curls and re-enters the skin instead of growing straight out of the skin.  A blocked pore.  Irritated skin.  An injury to the skin.  Certain conditions that are passed along from parent to child (inherited).  Human papillomavirus (HPV).  Long-term sun damage to the skin. What increases the risk?  Having acne.  Being overweight.  Being 30-40 years old. What are the signs or symptoms? These cysts are usually harmless, but they can get infected. Symptoms of infection may include:  Redness.  Inflammation.  Tenderness.  Warmth.  Fever.  A grayish-white, bad-smelling substance drains from the cyst.  Pus drains from the cyst. How is this treated? In many cases, epidermal cysts go away on their own without treatment. If a cyst becomes infected, treatment may include:  Opening and draining the cyst, done by a doctor. After draining, you may need minor surgery to remove the rest of the cyst.  Antibiotic medicine.  Shots of medicines (steroids) that help to reduce inflammation.  Surgery to remove the cyst. Surgery may be done if the cyst: ? Becomes large. ? Bothers you. ? Has a chance of turning into cancer.  Do not try to open a cyst yourself. Follow these instructions at home:  Take over-the-counter and prescription medicines only as told by your doctor.  If you were prescribed an antibiotic medicine, take it it as told by your doctor. Do not stop using the antibiotic even if you start to feel better.  Keep the area around your cyst clean and dry.  Wear loose, dry clothing.  Avoid touching your cyst.  Check your cyst every day for signs of infection. Check for: ? Redness, swelling, or pain. ? Fluid  or blood. ? Warmth. ? Pus or a bad smell.  Keep all follow-up visits as told by your doctor. This is important. How is this prevented?  Wear clean, dry, clothing.  Avoid wearing tight clothing.  Keep your skin clean and dry. Take showers or baths every day. Contact a doctor if:  Your cyst has symptoms of infection.  Your condition does not improve or gets worse.  You have a cyst that looks different from other cysts you have had.  You have a fever. Get help right away if:  Redness spreads from the cyst into the area close by. Summary  An epidermal cyst is a sac made of skin tissue.  If a cyst becomes infected, treatment may include surgery to open and drain the cyst, or to remove it.  Take over-the-counter and prescription medicines only as told by your doctor.  Contact a doctor if your condition is not improving or is getting worse.  Keep all follow-up visits as told by your doctor. This is important. This information is not intended to replace advice given to you by your health care provider. Make sure you discuss any questions you have with your health care provider. Document Revised: 12/05/2018 Document Reviewed: 05/23/2018 Elsevier Patient Education  2020 Elsevier Inc.  

## 2019-11-26 ENCOUNTER — Ambulatory Visit: Payer: Medicaid Other | Attending: Internal Medicine

## 2019-11-26 DIAGNOSIS — Z23 Encounter for immunization: Secondary | ICD-10-CM

## 2019-11-26 NOTE — Progress Notes (Signed)
   Covid-19 Vaccination Clinic  Name:  Christine Koch    MRN: [id number] DOB: [birth year]  11/26/2019  [name] was observed post Covid-19 immunization for 15 minutes without incident. She was provided with Vaccine Information Sheet and instruction to access the V-Safe system.   [name] was instructed to call 911 with any severe reactions post vaccine: Marland Kitchen Difficulty breathing  . Swelling of face and throat  . A fast heartbeat  . A bad rash all over body  . Dizziness and weakness   Immunizations Administered    Name Date Dose VIS Date Route   Pfizer COVID-19 Vaccine 11/26/2019 10:15 AM 0.3 mL 08/08/2019 Intramuscular   Manufacturer: [place]   Lot: [id number]   [place]: [id number]

## 2019-11-27 ENCOUNTER — Ambulatory Visit (INDEPENDENT_AMBULATORY_CARE_PROVIDER_SITE_OTHER): Payer: Medicaid Other | Admitting: Primary Care

## 2019-11-27 ENCOUNTER — Other Ambulatory Visit: Payer: Self-pay

## 2019-11-27 ENCOUNTER — Encounter: Payer: Self-pay | Admitting: Primary Care

## 2019-11-27 DIAGNOSIS — M545 Low back pain, unspecified: Secondary | ICD-10-CM

## 2019-11-27 DIAGNOSIS — M549 Dorsalgia, unspecified: Secondary | ICD-10-CM | POA: Insufficient documentation

## 2019-11-27 MED ORDER — TIZANIDINE HCL 4 MG PO TABS: 4.0000 mg | ORAL_TABLET | Freq: Three times a day (TID) | ORAL | 0 refills | Status: DC | PRN

## 2019-11-27 MED ORDER — KETOROLAC TROMETHAMINE 60 MG/2ML IM SOLN
60.0000 mg | Freq: Once | INTRAMUSCULAR | Status: AC
  Administered 2019-11-27: 60 mg via INTRAMUSCULAR

## 2019-11-27 NOTE — Patient Instructions (Signed)
You may take tizanidine 4 mg every 8 hours as needed for muscle tension/spasm. This may cause drowsiness.  Avoid laying/sitting for prolonged periods of time. Be sure to get up and walk every hour.   You can take Tylenol and/or Ibuprofen if needed.  Use heat.  It was a pleasure to see you today!   Lumbar Strain A lumbar strain, which is sometimes called a low-back strain, is a stretch or tear in a muscle or the strong cords of tissue that attach muscle to bone (tendons) in the lower back (lumbar spine). This type of injury occurs when muscles or tendons are torn or are stretched beyond their limits. Lumbar strains can range from mild to severe. Mild strains may involve stretching a muscle or tendon without tearing it. These may heal in 1-2 weeks. More severe strains involve tearing of muscle fibers or tendons. These will cause more pain and may take 6-8 weeks to heal. What are the causes? This condition may be caused by:  Trauma, such as a fall or a hit to the body.  Twisting or overstretching the back. This may result from doing activities that need a lot of energy, such as lifting heavy objects. What increases the risk? This injury is more common in:  Athletes.  People with obesity.  People who do repeated lifting, bending, or other movements that involve their back. What are the signs or symptoms? Symptoms of this condition may include:  Sharp or dull pain in the lower back that does not go away. The pain may extend to the buttocks.  Stiffness or limited range of motion.  Sudden muscle tightening (spasms). How is this diagnosed? This condition may be diagnosed based on:  Your symptoms.  Your medical history.  A physical exam.  Imaging tests, such as: ? X-rays. ? MRI. How is this treated? Treatment for this condition may include:  Rest.  Applying heat and cold to the affected area.  Over-the-counter medicines to help relieve pain and inflammation, such as  NSAIDs.  Prescription pain medicine and muscle relaxants may be needed for a short time.  Physical therapy. Follow these instructions at home: Managing pain, stiffness, and swelling      If directed, put ice on the injured area during the first 24 hours after your injury. ? Put ice in a plastic bag. ? Place a towel between your skin and the bag. ? Leave the ice on for 20 minutes, 2-3 times a day.  If directed, apply heat to the affected area as often as told by your health care provider. Use the heat source that your health care provider recommends, such as a moist heat pack or a heating pad. ? Place a towel between your skin and the heat source. ? Leave the heat on for 20-30 minutes. ? Remove the heat if your skin turns bright red. This is especially important if you are unable to feel pain, heat, or cold. You may have a greater risk of getting burned. Activity  Rest and return to your normal activities as told by your health care provider. Ask your health care provider what activities are safe for you.  Do exercises as told by your health care provider. Medicines  Take over-the-counter and prescription medicines only as told by your health care provider.  Ask your health care provider if the medicine prescribed to you: ? Requires you to avoid driving or using heavy machinery. ? Can cause constipation. You may need to take these actions to  prevent or treat constipation:  Drink enough fluid to keep your urine pale yellow.  Take over-the-counter or prescription medicines.  Eat foods that are high in fiber, such as beans, whole grains, and fresh fruits and vegetables.  Limit foods that are high in fat and processed sugars, such as fried or sweet foods. Injury prevention To prevent a future low-back injury:  Always warm up properly before physical activity or sports.  Cool down and stretch after being active.  Use correct form when playing sports and lifting heavy objects.  Bend your knees before you lift heavy objects.  Use good posture when sitting and standing.  Stay physically fit and keep a healthy weight. ? Do at least 150 minutes of moderate-intensity exercise each week, such as brisk walking or water aerobics. ? Do strength exercises at least 2 times each week.  General instructions  Do not use any products that contain nicotine or tobacco, such as cigarettes, e-cigarettes, and chewing tobacco. If you need help quitting, ask your health care provider.  Keep all follow-up visits as told by your health care provider. This is important. Contact a health care provider if:  Your back pain does not improve after 6 weeks of treatment.  Your symptoms get worse. Get help right away if:  Your back pain is severe.  You are unable to stand or walk.  You develop pain in your legs.  You develop weakness in your buttocks or legs.  You have difficulty controlling when you urinate or when you have a bowel movement. ? You have frequent, painful, or bloody urination. ? You have a temperature over 101.79F (38.3C) Summary  A lumbar strain, which is sometimes called a low-back strain, is a stretch or tear in a muscle or the strong cords of tissue that attach muscle to bone (tendons) in the lower back (lumbar spine).  This type of injury occurs when muscles or tendons are torn or are stretched beyond their limits.  Rest and return to your normal activities as told by your health care provider. If directed, apply heat and ice to the affected area as often as told by your health care provider.  Take over-the-counter and prescription medicines only as told by your health care provider.  Contact a health care provider if you have new or worsening symptoms. This information is not intended to replace advice given to you by your health care provider. Make sure you discuss any questions you have with your health care provider. Document Revised: 06/13/2018 Document  Reviewed: 06/13/2018 Elsevier Patient Education  [place].

## 2019-11-27 NOTE — Addendum Note (Signed)
Addended by: Jacqualin Combes on: 11/27/2019 09:25 AM   Modules accepted: Orders

## 2019-11-27 NOTE — Assessment & Plan Note (Signed)
Acute since yesterday at work. Suspect this was provoked with prolonged leaning forward in the same position.   Exam today consistent for muscle spasms. No alarm signs. Not consistent for UA.   Discussed to use heat, stretch, avoid prolonged sitting. IM Toradol provided today. Rx for Tizanidine muscle relaxer provided.  She will update.

## 2019-11-27 NOTE — Progress Notes (Signed)
Subjective:    Patient ID: Christine Koch, female    DOB: 09-10-1975, 44 y.o.   MRN: [id number]  HPI  This visit occurred during the SARS-CoV-2 public health emergency.  Safety protocols were in place, including screening questions prior to the visit, additional usage of staff PPE, and extensive cleaning of exam room while observing appropriate contact time as indicated for disinfecting solutions.   Christine Koch is a 44 year old female with a history of CVA, traumatic brain injury, obesity, migraines who presents today with a chief complaint of back pain.   Her pain is located to the left lower back which began yesterday afternoon while at work. She had to leave work early due to discomfort, pain increased exponentially once she got home. She works leaning forward and Buyer, retail.   She's tried applying ice, taking Tylenol/Ibuprofen without much improvement. She denies urinary symptoms, radiculopathy to lower extremities, numbness/tinlging, loss of bowel/bladder control, injury/trauma.   Review of Systems  Musculoskeletal: Positive for myalgias.  Skin: Negative for rash.  Neurological: Negative for weakness and numbness.       Past Medical History:  Diagnosis Date  . 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 Depression   . Dermatophytosis of nail   . Diabetes ([place])   . H/O: pneumonia   . Headache   . Impacted cerumen   . Internal hemorrhoids without mention of complication   . Other acute otitis externa   . Thyroid disease   . Unspecified cerebral artery occlusion with cerebral infarction 1995     Social History   Socioeconomic History  . Marital status: Single    Spouse name: Not on file  . Number of children: Not on file  . Years of education: Not on file  . Highest education level: Not on file  Occupational History  . Not on file  Tobacco Use  . Smoking status: Never Smoker  . Smokeless tobacco: Never Used  Substance and  Sexual Activity  . Alcohol use: Not Currently    Alcohol/week: 0.0 standard drinks  . Drug use: Not Currently  . Sexual activity: Not Currently    Comment: intercourse age 40 , less than 5 sexual partners  Other Topics Concern  . Not on file  Social History Narrative  . Not on file   Social Determinants of Health   Financial Resource Strain:   . Difficulty of Paying Living Expenses:   Food Insecurity:   . Worried About Charity fundraiser in the Last Year:   . Arboriculturist in the Last Year:   Transportation Needs:   . Film/video editor (Medical):   Marland Kitchen Lack of Transportation (Non-Medical):   Physical Activity:   . Days of Exercise per Week:   . Minutes of Exercise per Session:   Stress:   . Feeling of Stress :   Social Connections:   . Frequency of Communication with Friends and Family:   . Frequency of Social Gatherings with Friends and Family:   . Attends Religious Services:   . Active Member of Clubs or Organizations:   . Attends Archivist Meetings:   Marland Kitchen Marital Status:   Intimate Partner Violence:   . Fear of Current or Ex-Partner:   . Emotionally Abused:   Marland Kitchen Physically Abused:   . Sexually Abused:     Past Surgical History:  Procedure Laterality Date  . ADENOIDECTOMY  1979  . MYRINGOTOMY  1979  .  NEUROENDOSCOPIC PLACEMENT / REPLACEMENT VENTRICULAR CATHETER W/ ATTACHMENT SHUNT / EXTERNAL DRAIN  1995   for hydrocephaly  . TONSILLECTOMY  1993  . TUMOR REMOVAL  1995   Pituitary tumor    Family History  Problem Relation Age of Onset  . Diabetes Mother   . Hypertension Mother   . Colon polyps Mother   . Diabetes Maternal Aunt   . Diabetes Maternal Grandmother   . Hypertension Maternal Grandmother   . Heart disease Maternal Grandmother   . Cancer Maternal Grandfather        unknown type? possibly lung cancer  . Diabetes Paternal Grandmother   . Colon cancer Neg Hx   . Stomach cancer Neg Hx     Allergies  Allergen Reactions  . Codeine      REACTION: Vomiting    Current Outpatient Medications on File Prior to Visit  Medication Sig Dispense Refill  . AMBULATORY NON FORMULARY MEDICATION Medication Name: 0.125% Nitroglycerin gel 1 pea sized amount per rectum three times a day (Patient taking differently: as needed. Medication Name: 0.125% Nitroglycerin gel 1 pea sized amount per rectum three times a day) 30 g 1  . cabergoline (DOSTINEX) 0.5 MG tablet Take 0.25 mg by mouth 2 (two) times a week. Mondays and Thursdays     . DULoxetine (CYMBALTA) 30 MG capsule Take 30 mg by mouth daily.      . fluticasone (FLONASE) 50 MCG/ACT nasal spray Place into both nostrils daily as needed.     Marland Kitchen levocetirizine (XYZAL) 5 MG tablet Take 5 mg by mouth daily as needed.     Marland Kitchen levothyroxine (SYNTHROID, LEVOTHROID) 175 MCG tablet Take 175 mcg by mouth daily.      Marland Kitchen linaclotide (LINZESS) 290 MCG CAPS capsule Take 290 mcg by mouth daily before breakfast.    . metFORMIN (GLUCOPHAGE) 500 MG tablet Take 1,000 mg by mouth 2 (two) times daily with a meal. One in the am and two in pm     . mirabegron ER (MYRBETRIQ) 25 MG TB24 tablet Take 25 mg by mouth daily.    . rosuvastatin (CRESTOR) 10 MG tablet Take 10 mg by mouth 2 (two) times a week. Mondays and Thursdays     . tolterodine (DETROL LA) 4 MG 24 hr capsule Take 4 mg by mouth daily.    Marland Kitchen topiramate (TOPAMAX) 25 MG capsule Take 50 mg by mouth 2 (two) times daily.      . verapamil (CALAN-SR) 240 MG CR tablet Take 480 mg by mouth at bedtime.      . Vitamin D, Ergocalciferol, (DRISDOL) 50000 units CAPS capsule Take 5,000 Units by mouth once a week.  6   No current facility-administered medications on file prior to visit.    BP (!) 140/92   Pulse 82   Temp (!) 96 F (35.6 C) (Temporal)   Ht 5\' 6"  (1.676 m)   Wt 254 lb 6 oz (115.4 kg)   LMP 11/02/2019   SpO2 98%   BMI 41.06 kg/m    Objective:   Physical Exam  Constitutional: She appears well-nourished.  Respiratory: Effort normal.    Musculoskeletal:     Lumbar back: Pain and spasms present. No tenderness. Decreased range of motion.       Back:  Skin: Skin is warm and dry. No erythema.           Assessment & Plan:

## 2019-12-26 ENCOUNTER — Other Ambulatory Visit: Payer: Self-pay | Admitting: Endocrinology

## 2019-12-26 ENCOUNTER — Encounter: Payer: Self-pay | Admitting: Radiology

## 2019-12-26 DIAGNOSIS — Z9289 Personal history of other medical treatment: Secondary | ICD-10-CM

## 2019-12-26 DIAGNOSIS — D352 Benign neoplasm of pituitary gland: Secondary | ICD-10-CM

## 2020-01-24 ENCOUNTER — Ambulatory Visit
Admission: RE | Admit: 2020-01-24 | Discharge: 2020-01-24 | Disposition: A | Discharge status: Home / Self Care | Payer: Medicaid Other | Source: Ambulatory Visit | Attending: Endocrinology | Admitting: Endocrinology

## 2020-01-24 DIAGNOSIS — Z9289 Personal history of other medical treatment: Secondary | ICD-10-CM

## 2020-01-24 DIAGNOSIS — D352 Benign neoplasm of pituitary gland: Secondary | ICD-10-CM

## 2020-01-24 MED ORDER — GADOBENATE DIMEGLUMINE 529 MG/ML IV SOLN
10.0000 mL | Freq: Once | INTRAVENOUS | Status: AC | PRN
  Administered 2020-01-24: 10 mL via INTRAVENOUS

## 2020-05-31 ENCOUNTER — Other Ambulatory Visit: Payer: Self-pay | Admitting: Obstetrics & Gynecology

## 2020-05-31 DIAGNOSIS — Z1231 Encounter for screening mammogram for malignant neoplasm of breast: Secondary | ICD-10-CM

## 2020-07-19 ENCOUNTER — Ambulatory Visit: Payer: Medicaid Other

## 2020-08-06 ENCOUNTER — Ambulatory Visit
Admission: RE | Admit: 2020-08-06 | Discharge: 2020-08-06 | Disposition: A | Discharge status: Home / Self Care | Payer: Medicaid Other | Source: Ambulatory Visit | Attending: Obstetrics & Gynecology | Admitting: Obstetrics & Gynecology

## 2020-08-06 ENCOUNTER — Other Ambulatory Visit: Payer: Self-pay

## 2020-08-06 DIAGNOSIS — Z1231 Encounter for screening mammogram for malignant neoplasm of breast: Secondary | ICD-10-CM

## 2020-08-09 ENCOUNTER — Other Ambulatory Visit: Payer: Self-pay

## 2020-08-09 ENCOUNTER — Ambulatory Visit (INDEPENDENT_AMBULATORY_CARE_PROVIDER_SITE_OTHER): Payer: Medicaid Other | Admitting: Obstetrics & Gynecology

## 2020-08-09 ENCOUNTER — Encounter: Payer: Self-pay | Admitting: Obstetrics & Gynecology

## 2020-08-09 VITALS — BP 120/82 | Ht 68.75 in | Wt 255.0 lb

## 2020-08-09 DIAGNOSIS — Z01419 Encounter for gynecological examination (general) (routine) without abnormal findings: Secondary | ICD-10-CM

## 2020-08-09 DIAGNOSIS — Z6837 Body mass index (BMI) 37.0-37.9, adult: Secondary | ICD-10-CM

## 2020-08-09 NOTE — Progress Notes (Signed)
    Christine Koch 01-11-1976 [id number]   History:    44 y.o. G0 Single. Presented with her mother  OL:[id number] presenting for annual gyn exam   HPI:H/O Head trauma/Hydrocephaly. Followed by Dr Forde Dandy for Hypothyroidism/Hyperprolactinemia/DM type 2. Menstrual periods regular every month. No pelvic pain. Normal vaginal secretions. Abstinent. Treated for urinary urgency, on Myrbetriq now. Bowel movements normal. Breasts normal. Body mass index increased to 37.93.    Past medical history,surgical history, family history and social history were all reviewed and documented in the EPIC chart.  Gynecologic History Patient's last menstrual period was 08/06/2020.  Obstetric History OB History  Gravida Para Term Preterm AB Living  0 0 0 0 0 0  SAB IAB Ectopic Multiple Live Births  0 0 0 0 0     ROS: A ROS was performed and pertinent positives and negatives are included in the history.  GENERAL: No fevers or chills. HEENT: No change in vision, no earache, sore throat or sinus congestion. NECK: No pain or stiffness. CARDIOVASCULAR: No chest pain or pressure. No palpitations. PULMONARY: No shortness of breath, cough or wheeze. GASTROINTESTINAL: No abdominal pain, nausea, vomiting or diarrhea, melena or bright red blood per rectum. GENITOURINARY: No urinary frequency, urgency, hesitancy or dysuria. MUSCULOSKELETAL: No joint or muscle pain, no back pain, no recent trauma. DERMATOLOGIC: No rash, no itching, no lesions. ENDOCRINE: No polyuria, polydipsia, no heat or cold intolerance. No recent change in weight. HEMATOLOGICAL: No anemia or easy bruising or bleeding. NEUROLOGIC: No headache, seizures, numbness, tingling or weakness. PSYCHIATRIC: No depression, no loss of interest in normal activity or change in sleep pattern.     Exam:   BP 120/82   Ht 5' 8.75" (1.746 m)   Wt 255 lb (115.7 kg)   LMP 08/06/2020   BMI 37.93 kg/m   Body mass index is 37.93 kg/m.  General  appearance : Well developed well nourished female. No acute distress HEENT: Eyes: no retinal hemorrhage or exudates,  Neck supple, trachea midline, no carotid bruits, no thyroidmegaly Lungs: Clear to auscultation, no rhonchi or wheezes, or rib retractions  Heart: Regular rate and rhythm, no murmurs or gallops Breast:Examined in sitting and supine position were symmetrical in appearance, no palpable masses or tenderness,  no skin retraction, no nipple inversion, no nipple discharge, no skin discoloration, no axillary or supraclavicular lymphadenopathy Abdomen: no palpable masses or tenderness, no rebound or guarding Extremities: no edema or skin discoloration or tenderness  Pelvic: Deferred, Virgin   Assessment/Plan:  44 y.o. female for annual exam   1. Well female exam with routine gynecological exam Gynecologic exam deferred as patient is asymptomatic and virgin.  No indication for a Pap test.  Breast exam normal.  Screening mammogram December 2021 was negative.  Health labs with family physician.  2. Class 2 severe obesity due to excess calories with serious comorbidity and body mass index (BMI) of 37.0 to 37.9 in adult [facility]) Recommend a lower calorie/carb diet.  Aerobic activities 5 times a week and light weightlifting every 2 days.  Other orders - verapamil (CALAN) 120 MG tablet; Take 120 mg by mouth 3 (three) times daily.  Princess Bruins MD, 2:50 PM 08/09/2020

## 2020-08-10 ENCOUNTER — Encounter: Payer: Self-pay | Admitting: Obstetrics & Gynecology

## 2020-11-09 ENCOUNTER — Other Ambulatory Visit: Payer: Self-pay

## 2020-11-09 ENCOUNTER — Encounter: Payer: Self-pay | Admitting: Nurse Practitioner

## 2020-11-09 ENCOUNTER — Ambulatory Visit (INDEPENDENT_AMBULATORY_CARE_PROVIDER_SITE_OTHER): Payer: Medicaid Other | Admitting: Nurse Practitioner

## 2020-11-09 VITALS — BP 130/80

## 2020-11-09 DIAGNOSIS — N907 Vulvar cyst: Secondary | ICD-10-CM | POA: Diagnosis not present

## 2020-11-09 MED ORDER — DOXYCYCLINE MONOHYDRATE 100 MG PO CAPS: 100.0000 mg | ORAL_CAPSULE | Freq: Two times a day (BID) | ORAL | 0 refills | Status: AC

## 2020-11-09 NOTE — Progress Notes (Signed)
   Acute Office Visit  Subjective:    Patient ID: Christine Koch, female    DOB: Jan 01, 1976, 45 y.o.   MRN: [id number]   HPI 45 y.o. presents today for vaginal bump that she noticed about a month ago. It is on the inside of her left labia and is very sore to the touch. She notices pain with walking, sitting, and wiping. Denies drainage and is unsure of change in size. Mother is present during visit.    Review of Systems  Constitutional: Negative.  Negative for chills and fever.  Genitourinary: Positive for vaginal pain (left labial lump).       Objective:    Physical Exam Constitutional:      Appearance: Normal appearance.  Genitourinary:      BP 130/80 (BP Location: Right Arm, Patient Position: Sitting, Cuff Size: Large)   LMP 11/02/2020  Wt Readings from Last 3 Encounters:  08/09/20 255 lb (115.7 kg)  11/27/19 254 lb 6 oz (115.4 kg)  11/11/19 258 lb (117 kg)        Assessment & Plan:   Problem List Items Addressed This Visit   None   Visit Diagnoses    Labial cyst    -  Primary   Relevant Medications   doxycycline (MONODOX) 100 MG capsule   Other Relevant Orders   Wound culture     Plan:   Procedure: Discussed risks and benefits of the procedure, as well as the alternative of monitoring. Patient wants drained due to discomfort. Verbal consent obtained from patient and mother.   Incision & drainage performed of fluid-filled cyst of left labia. The area was prepped, cleaned with betadine, and locally anesthetized. #11 scalpel used to make puncture over the most fluctuant area. I then expressed any fluid that I could. Area cleaned and no dressing required. Pad provided for any leftover drainage.   The patient tolerated the procedure well without complications.  Standard post-procedure care is explained and return  precautions are given. Doxycycline 100 mg BID x 7 days empirically. Wound culture pending. Drainage mostly blood with a small amount of pus mixed  in. All questions answered.    Tamela Gammon DNP, 3:06 PM 11/09/2020

## 2020-11-12 LAB — WOUND CULTURE
MICRO NUMBER:: 11649110
RESULT:: NORMAL
SPECIMEN QUALITY:: ADEQUATE

## 2021-07-11 ENCOUNTER — Other Ambulatory Visit: Payer: Self-pay | Admitting: Obstetrics & Gynecology

## 2021-07-11 DIAGNOSIS — Z1231 Encounter for screening mammogram for malignant neoplasm of breast: Secondary | ICD-10-CM

## 2021-08-10 ENCOUNTER — Ambulatory Visit: Payer: Self-pay | Admitting: Obstetrics & Gynecology

## 2021-08-12 ENCOUNTER — Ambulatory Visit
Admission: RE | Admit: 2021-08-12 | Discharge: 2021-08-12 | Disposition: A | Discharge status: Home / Self Care | Payer: Medicaid Other | Source: Ambulatory Visit | Attending: Obstetrics & Gynecology | Admitting: Obstetrics & Gynecology

## 2021-08-12 ENCOUNTER — Other Ambulatory Visit: Payer: Self-pay

## 2021-08-12 DIAGNOSIS — Z1231 Encounter for screening mammogram for malignant neoplasm of breast: Secondary | ICD-10-CM

## 2021-08-15 ENCOUNTER — Ambulatory Visit: Payer: Medicaid Other | Admitting: Obstetrics & Gynecology

## 2021-11-10 ENCOUNTER — Ambulatory Visit: Payer: Medicaid Other | Admitting: Obstetrics & Gynecology

## 2022-01-16 ENCOUNTER — Encounter: Payer: Self-pay | Admitting: Family

## 2022-01-16 ENCOUNTER — Ambulatory Visit (INDEPENDENT_AMBULATORY_CARE_PROVIDER_SITE_OTHER): Payer: Medicaid Other | Admitting: Family

## 2022-01-16 VITALS — BP 118/70 | HR 75 | Temp 98.3°F | Resp 16 | Ht 68.75 in | Wt 244.1 lb

## 2022-01-16 DIAGNOSIS — N949 Unspecified condition associated with female genital organs and menstrual cycle: Secondary | ICD-10-CM

## 2022-01-16 DIAGNOSIS — N898 Other specified noninflammatory disorders of vagina: Secondary | ICD-10-CM | POA: Insufficient documentation

## 2022-01-16 DIAGNOSIS — R350 Frequency of micturition: Secondary | ICD-10-CM | POA: Diagnosis not present

## 2022-01-16 MED ORDER — SULFAMETHOXAZOLE-TRIMETHOPRIM 800-160 MG PO TABS: 1.0000 | ORAL_TABLET | Freq: Two times a day (BID) | ORAL | 0 refills | Status: DC

## 2022-01-16 NOTE — Patient Instructions (Signed)
Due to recent changes in healthcare laws, you may see results of your imaging and/or laboratory studies on MyChart before I have had a chance to review them.  I understand that in some cases there may be results that are confusing or concerning to you. Please understand that not all results are received at the same time and often I may need to interpret multiple results in order to provide you with the best plan of care or course of treatment. Therefore, I ask that you please give me 2 business days to thoroughly review all your results before contacting my office for clarification. Should we see a critical lab result, you will be contacted sooner.   It was a pleasure seeing you today! Please do not hesitate to reach out with any questions and or concerns.  Regards,   Solash Tullo FNP-C  

## 2022-01-16 NOTE — Progress Notes (Signed)
Established Patient Office Visit  Subjective:  Patient ID: Christine Koch, female    DOB: 1975-09-29  Age: 46 y.o. MRN: [id number]  CC:  Chief Complaint  Patient presents with  . Vaginal Pain    Since today    HPI Christine Koch is here today with concerns.   Vaginal burning throughout the day.  No vaginal discharge.  No urinary frequency, no urgency. Not burning when peeing.  No back pain no fever or chills.   Told her mom yesterday she noticed a possible lump on one of the sides of her vagina, however this am, she told mom 'her vagina was on fire'.   Did have her period last week.  Does take a shower every am.   Past Medical History:  Diagnosis Date  . 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 Depression   . Dermatophytosis of nail   . Diabetes ([place])   . H/O: pneumonia   . Headache   . Impacted cerumen   . Internal hemorrhoids without mention of complication   . Other acute otitis externa   . Thyroid disease   . Unspecified cerebral artery occlusion with cerebral infarction 1995    Past Surgical History:  Procedure Laterality Date  . ADENOIDECTOMY  1979  . MYRINGOTOMY  1979  . TONSILLECTOMY  1993  . TUMOR REMOVAL  1995   Pituitary tumor  . VENTRICULO-PERITONEAL SHUNT PLACEMENT / [place]   for hydrocephaly    Family History  Problem Relation Age of Onset  . Diabetes Mother   . Hypertension Mother   . Colon polyps Mother   . Diabetes Maternal Aunt   . Diabetes Maternal Grandmother   . Hypertension Maternal Grandmother   . Heart disease Maternal Grandmother   . Cancer Maternal Grandfather        unknown type? possibly lung cancer  . Diabetes Paternal Grandmother   . Colon cancer Neg Hx   . Stomach cancer Neg Hx     Social History   Socioeconomic History  . Marital status: Single    Spouse name: Not on file  . Number of children: Not on file  . Years of  education: Not on file  . Highest education level: Not on file  Occupational History  . Not on file  Tobacco Use  . Smoking status: Never  . Smokeless tobacco: Never  Vaping Use  . Vaping Use: Never used  Substance and Sexual Activity  . Alcohol use: Not Currently    Alcohol/week: 0.0 standard drinks  . Drug use: Not Currently  . Sexual activity: Not Currently    Comment: intercourse age 7 , less than 5 sexual partners  Other Topics Concern  . Not on file  Social History Narrative  . Not on file   Social Determinants of Health   Financial Resource Strain: Not on file  Food Insecurity: Not on file  Transportation Needs: Not on file  Physical Activity: Not on file  Stress: Not on file  Social Connections: Not on file  Intimate Partner Violence: Not on file    Outpatient Medications Prior to Visit  Medication Sig Dispense Refill  . AMBULATORY NON FORMULARY MEDICATION Medication Name: 0.125% Nitroglycerin gel 1 pea sized amount per rectum three times a day (Patient taking differently: as needed. Medication Name: 0.125% Nitroglycerin gel 1 pea sized amount per rectum three times a day) 30 g 1  . cabergoline (DOSTINEX) 0.5  MG tablet Take 0.25 mg by mouth 2 (two) times a week. Mondays and Thursdays    . DULoxetine (CYMBALTA) 30 MG capsule Take 30 mg by mouth daily.    . fluticasone (FLONASE) 50 MCG/ACT nasal spray Place into both nostrils daily as needed.     Marland Kitchen levothyroxine (SYNTHROID, LEVOTHROID) 175 MCG tablet Take 175 mcg by mouth daily.    Marland Kitchen linaclotide (LINZESS) 290 MCG CAPS capsule Take 290 mcg by mouth daily before breakfast.    . metFORMIN (GLUCOPHAGE) 500 MG tablet Take 1,000 mg by mouth 2 (two) times daily with a meal. One in the am and two in pm    . mirabegron ER (MYRBETRIQ) 25 MG TB24 tablet Take 50 mg by mouth daily.    Marland Kitchen MOUNJARO 5 MG/0.5ML Pen Inject into the skin.    . rosuvastatin (CRESTOR) 10 MG tablet Take 10 mg by mouth 2 (two) times a week. Mondays and  Thursdays    . tolterodine (DETROL LA) 4 MG 24 hr capsule Take 4 mg by mouth daily.    Marland Kitchen topiramate (TOPAMAX) 25 MG capsule Take 50 mg by mouth 2 (two) times daily.    . Vitamin D, Ergocalciferol, (DRISDOL) 50000 units CAPS capsule Take 5,000 Units by mouth once a week.  6  . levocetirizine (XYZAL) 5 MG tablet Take 5 mg by mouth daily as needed.  (Patient not taking: Reported on 01/16/2022)    . tiZANidine (ZANAFLEX) 4 MG tablet Take 1 tablet (4 mg total) by mouth every 8 (eight) hours as needed for muscle spasms. (Patient not taking: Reported on 01/16/2022) 15 tablet 0   No facility-administered medications prior to visit.    Allergies  Allergen Reactions  . Codeine     REACTION: Vomiting    ROS Review of Systems  Constitutional:  Negative for chills, fatigue, fever and unexpected weight change.  Gastrointestinal:  Negative for abdominal pain.  Endocrine: Negative for cold intolerance and heat intolerance.  Genitourinary:  Negative for difficulty urinating, dysuria, flank pain, frequency, hematuria, menstrual problem, pelvic pain, urgency, vaginal bleeding, vaginal discharge and vaginal pain.       No breast pain, no breast rash, no nipple changes, no nipple discharge  Skin:  Negative for rash.  Psychiatric/Behavioral:  Negative for sleep disturbance and suicidal ideas. The patient is not nervous/anxious.   All other systems reviewed and are negative.    Objective:    Physical Exam  BP 118/70   Pulse 75   Temp 98.3 F (36.8 C)   Resp 16   Ht 5' 8.75" (1.746 m)   Wt 244 lb 2 oz (110.7 kg)   LMP 01/09/2022 (Approximate)   SpO2 99%   BMI 36.31 kg/m  Wt Readings from Last 3 Encounters:  01/16/22 244 lb 2 oz (110.7 kg)  08/09/20 255 lb (115.7 kg)  11/27/19 254 lb 6 oz (115.4 kg)     Health Maintenance Due  Topic Date Due  . HIV Screening  Never done  . Hepatitis C Screening  Never done  . COVID-19 Vaccine (3 - Booster for Pfizer series) 01/21/2020  . PAP  SMEAR-Modifier  12/05/2020    There are no preventive care reminders to display for this patient.  No results found for: TSH Lab Results  Component Value Date   WBC 10.0 03/07/2017   HGB 11.5 (L) 10/16/2017   HCT 34.8 (L) 03/07/2017   MCV 90.7 03/07/2017   PLT 310.0 03/07/2017   Lab Results  Component Value Date  NA 139 03/07/2017   K 3.7 03/07/2017   CO2 26 03/07/2017   GLUCOSE 156 (H) 03/07/2017   BUN 16 03/07/2017   CREATININE 0.82 03/07/2017   BILITOT 0.3 03/07/2017   ALKPHOS 88 03/07/2017   AST 37 03/07/2017   ALT 43 (H) 03/07/2017   PROT 7.2 03/07/2017   ALBUMIN 4.1 03/07/2017   CALCIUM 9.3 03/07/2017   GFR 82.25 03/07/2017   No results found for: HGBA1C    Assessment & Plan:   Problem List Items Addressed This Visit   None   No orders of the defined types were placed in this encounter.   Follow-up: No follow-ups on file.    Eugenia Pancoast, FNP

## 2022-01-17 ENCOUNTER — Other Ambulatory Visit: Payer: Self-pay | Admitting: Family

## 2022-01-17 DIAGNOSIS — B9689 Other specified bacterial agents as the cause of diseases classified elsewhere: Secondary | ICD-10-CM

## 2022-01-17 LAB — WET PREP BY MOLECULAR PROBE
Candida species: NOT DETECTED
MICRO NUMBER:: 13427456
SPECIMEN QUALITY:: ADEQUATE
Trichomonas vaginosis: NOT DETECTED

## 2022-01-17 LAB — URINE CULTURE
MICRO NUMBER:: 13427457
SPECIMEN QUALITY:: ADEQUATE

## 2022-01-17 MED ORDER — METRONIDAZOLE 500 MG PO TABS: 500.0000 mg | ORAL_TABLET | Freq: Two times a day (BID) | ORAL | 0 refills | Status: AC

## 2022-01-17 NOTE — Assessment & Plan Note (Signed)
Suspected bartolin cyst  Pt mom to call gyn to get urgent appt Possible lancing needing Warm sitz baths, rx bactrim 800/160 mg po bid x 5 days

## 2022-01-17 NOTE — Assessment & Plan Note (Signed)
Pelvic exam performed with consent prior Wet prep ordered pending results

## 2022-01-17 NOTE — Assessment & Plan Note (Signed)
Order urine culture Pending results

## 2022-01-18 ENCOUNTER — Telehealth: Payer: Self-pay | Admitting: *Deleted

## 2022-01-18 NOTE — Telephone Encounter (Signed)
Dr.Lavoie replied "That's good, will see her on 5/26th. "  Patient mother informed.

## 2022-01-18 NOTE — Telephone Encounter (Signed)
Patient mother Karna Christmas called (has DPR access) patient seen her PCP on 01/16/22 (note in epic) due to vaginal burning and urinary frequency. PCP diagnosed patient with Suspected bartolin cyst treated with bactrim DS which mother states MD told patient to d/c because she also has BV currently taking Flagly 500 mg for BV. Mother states patient still has discomfort from Suspected bartolin cyst, was told to follow up with GYN. She is currently scheduled on 01/20/22 @ 10:00am. Please advise if you are okay with this date/time or if you want patient to be seen sooner. It appears you don't have any sooner appointments. Please advise

## 2022-01-20 ENCOUNTER — Encounter: Payer: Self-pay | Admitting: Obstetrics & Gynecology

## 2022-01-20 ENCOUNTER — Ambulatory Visit (INDEPENDENT_AMBULATORY_CARE_PROVIDER_SITE_OTHER): Payer: Medicaid Other | Admitting: Obstetrics & Gynecology

## 2022-01-20 VITALS — BP 110/76

## 2022-01-20 DIAGNOSIS — L723 Sebaceous cyst: Secondary | ICD-10-CM | POA: Diagnosis not present

## 2022-01-20 DIAGNOSIS — N760 Acute vaginitis: Secondary | ICD-10-CM | POA: Diagnosis not present

## 2022-01-20 DIAGNOSIS — B9689 Other specified bacterial agents as the cause of diseases classified elsewhere: Secondary | ICD-10-CM

## 2022-01-20 DIAGNOSIS — N762 Acute vulvitis: Secondary | ICD-10-CM | POA: Diagnosis not present

## 2022-01-20 MED ORDER — FLUCONAZOLE 150 MG PO TABS: 150.0000 mg | ORAL_TABLET | Freq: Once | ORAL | 1 refills | Status: AC

## 2022-01-20 MED ORDER — CLOBETASOL PROPIONATE 0.05 % EX OINT: 1.0000 "application " | TOPICAL_OINTMENT | Freq: Two times a day (BID) | CUTANEOUS | 0 refills | Status: DC

## 2022-01-20 NOTE — Progress Notes (Signed)
    Christine Koch Sep 04, 1975 [id number]        46 y.o.  G0 Single.  Accompanied by mother  RP: Vulvar cyst with vulvar irritation  HPI: Had a small Rt vulvar cyst at the small labia skin.  Tried to scrub and irritated her vulva which is red now.  Currently treating a Bacterial Vaginosis Dxed 01/16/22 with Metronidazole.  Urine Culture 01/16/22 Negative.   OB History  Gravida Para Term Preterm AB Living  0 0 0 0 0 0  SAB IAB Ectopic Multiple Live Births  0 0 0 0 0    Past medical history,surgical history, problem list, medications, allergies, family history and social history were all reviewed and documented in the EPIC chart.   Directed ROS with pertinent positives and negatives documented in the history of present illness/assessment and plan.  Exam:  Vitals:   01/20/22 0956  BP: 110/76   General appearance:  Normal  Gynecologic exam: Erythema at the vulva bilaterally.  Small Sebaceous cyst at the Rt labia minora, no sign of infection.   Assessment/Plan:  46 y.o. G0  1. Sebaceous cyst Small right vulvar Sebaceous cyst, no sign of infection.  Counseling done.  Will do warm soaking with Sitz baths.  Avoid pressing on it.  Reassured.  2. Acute vulvitis Probably contact vulvitis.  Will decrease the inflammation with Clobetasol 0.05% ointment, a thin layer HS on the affected vulva.  Prescription sent to pharmacy.  3. Bacterial vaginosis Under treatment with Metronidazole.  Will cover with Fluconazole 150 mg 1 tab PO after the ABTx to prevent or treat yeast vaginitis.  Other orders - naproxen (NAPROSYN) 500 MG tablet; Take 500 mg by mouth daily as needed. - topiramate (TOPAMAX) 50 MG tablet; Take 50 mg by mouth 2 (two) times daily. - MYRBETRIQ 50 MG TB24 tablet; Take 50 mg by mouth daily. - UNABLE TO FIND; Med Name: fiber gummies - clobetasol ointment (TEMOVATE) 0.05 %; Apply 1 application. topically 2 (two) times daily. - fluconazole (DIFLUCAN) 150 MG tablet; Take 1  tablet (150 mg total) by mouth once for 1 dose. Take 1 tab PO after Metronidazole treatment for Bacterial Vaginosis   Counseling and management of sebaceous cysts, acute vulvitis and yeast vaginitis post ABTx for 25 minutes.  Princess Bruins MD, 10:01 AM 01/20/2022

## 2022-02-24 ENCOUNTER — Encounter: Payer: Self-pay | Admitting: Nurse Practitioner

## 2022-02-24 ENCOUNTER — Ambulatory Visit (INDEPENDENT_AMBULATORY_CARE_PROVIDER_SITE_OTHER)
Admission: RE | Admit: 2022-02-24 | Discharge: 2022-02-24 | Disposition: A | Discharge status: Home / Self Care | Payer: Medicaid Other | Source: Ambulatory Visit | Attending: Nurse Practitioner | Admitting: Nurse Practitioner

## 2022-02-24 ENCOUNTER — Ambulatory Visit (INDEPENDENT_AMBULATORY_CARE_PROVIDER_SITE_OTHER): Payer: Medicaid Other | Admitting: Nurse Practitioner

## 2022-02-24 VITALS — BP 102/78 | HR 75 | Temp 98.6°F | Resp 10 | Ht 68.75 in | Wt 237.6 lb

## 2022-02-24 DIAGNOSIS — R051 Acute cough: Secondary | ICD-10-CM

## 2022-02-24 DIAGNOSIS — J069 Acute upper respiratory infection, unspecified: Secondary | ICD-10-CM

## 2022-02-24 MED ORDER — BENZONATATE 100 MG PO CAPS: 100.0000 mg | ORAL_CAPSULE | Freq: Three times a day (TID) | ORAL | 0 refills | Status: DC | PRN

## 2022-02-24 MED ORDER — AMOXICILLIN-POT CLAVULANATE 875-125 MG PO TABS: 1.0000 | ORAL_TABLET | Freq: Two times a day (BID) | ORAL | 0 refills | Status: AC

## 2022-02-24 NOTE — Assessment & Plan Note (Signed)
We will send in Tessalon Perles 100 mg 3 times daily as needed called.  Did obtain a chest x-ray that was negative for acute abnormalities

## 2022-02-24 NOTE — Patient Instructions (Signed)
Nice to see you today Follow up if you do not improve I sent in 2 medications to your pharmacy. A cough pill and an antibiotic

## 2022-02-24 NOTE — Assessment & Plan Note (Signed)
Given patient's physical exam comorbidities will like to treat with Augmentin 875-125 twice daily for 7 days.  Patient will follow-up if no improvement.  Chest x-ray performed in office and report is back the time of closing note with no acute abnormalities.

## 2022-02-24 NOTE — Progress Notes (Signed)
Acute Office Visit  Subjective:     Patient ID: Christine Koch, female    DOB: 1976/08/24, 46 y.o.   MRN: [id number]  Chief Complaint  Patient presents with   Cough    Started around 02/18/22. Out of breath feeling, fatigue, has had sweats/feeling hot recently. Had a little bit of sore throat at the beginning but not now.  No sore throat. Has taking Nyquil     Patient is in today for cough  Patient is [place] by her mother who helps contribute to the HPI.  From started Saturday with a cough and 3 days ago it started with upper respiratory symptoms. Has been using diabetic cough syrup once that was financially switched over to [place] and nyquill for upper respiratory symptoms.  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  Hx of pna. Hx of Shunt, Hx of DM2 per patient's mother's report last A1c was 7.1%  Review of Systems  Constitutional:  Positive for chills, fever and malaise/fatigue.       Decreased appetitie Decreased fluid intake  HENT:  Positive for congestion, ear pain and sinus pain. Negative for sore throat.   Respiratory:  Positive for cough (sometime productive). Negative for shortness of breath.   Musculoskeletal:  Negative for joint pain and myalgias.  Neurological:  Negative for headaches.        Objective:    BP 102/78   Pulse 75   Temp 98.6 F (37 C)   Resp 10   Ht 5' 8.75" (1.746 m)   Wt 237 lb 9 oz (107.8 kg)   SpO2 96%   BMI 35.34 kg/m    Physical Exam Vitals and nursing note reviewed.  Constitutional:      Appearance: Normal appearance. She is obese.  HENT:     Right Ear: Tympanic membrane, ear canal and external ear normal.     Left Ear: Tympanic membrane, ear canal and external ear normal.     Nose:      Right Sinus: Frontal sinus tenderness present. No maxillary sinus tenderness.     Left Sinus: No maxillary sinus tenderness or frontal sinus tenderness.     Mouth/Throat:     Mouth: Mucous membranes are moist.     Pharynx: Oropharynx is clear.  Cardiovascular:     Rate and Rhythm: Normal rate and regular rhythm.  Pulmonary:     Effort: Pulmonary effort is normal.     Comments: Decreased bilateral lower lobes Abdominal:     General: Bowel sounds are normal.  Lymphadenopathy:     Cervical: No cervical adenopathy.  Skin:    General: Skin is warm.     Comments: Clammy  Neurological:     Mental Status: She is alert. Mental status is at baseline.     No results found for any visits on 02/24/22.      Assessment & Plan:   Problem List Items Addressed This Visit       Respiratory   Upper respiratory tract infection - Primary    Given patient's physical exam comorbidities will like to treat with Augmentin 875-125 twice daily for 7 days.  Patient will follow-up if no improvement.  Chest x-ray performed in office and report is back the time  of closing note with no acute abnormalities.      Relevant Medications   amoxicillin-clavulanate (AUGMENTIN) 875-125 MG tablet     Other   Acute cough    We will send in Tessalon Perles 100 mg 3 times daily as needed called.  Did obtain a chest x-ray that was negative for acute abnormalities      Relevant Medications   benzonatate (TESSALON PERLES) 100 MG capsule   Other Relevant Orders   DG Chest 2 View (Completed)    Meds ordered this encounter  Medications   amoxicillin-clavulanate (AUGMENTIN) 875-125 MG tablet    Sig: Take 1 tablet by mouth 2 (two) times daily for 7 days.    Dispense:  14 tablet    Refill:  0    Order Specific Question:   Supervising Provider    Answer:   Glori Bickers MARNE A [[id number]]   benzonatate (TESSALON PERLES) 100 MG capsule    Sig: Take 1 capsule (100 mg total) by mouth 3 (three) times daily as needed for  cough.    Dispense:  20 capsule    Refill:  0    Order Specific Question:   Supervising Provider    Answer:   TOWER, MARNE A [[id number]]    Return if symptoms worsen or fail to improve.  Romilda Garret, NP

## 2022-03-20 ENCOUNTER — Other Ambulatory Visit (HOSPITAL_COMMUNITY)
Admission: RE | Admit: 2022-03-20 | Discharge: 2022-03-20 | Disposition: A | Discharge status: Home / Self Care | Payer: Medicaid Other | Source: Ambulatory Visit | Attending: Obstetrics & Gynecology | Admitting: Obstetrics & Gynecology

## 2022-03-20 ENCOUNTER — Other Ambulatory Visit: Payer: Self-pay

## 2022-03-20 ENCOUNTER — Encounter: Payer: Self-pay | Admitting: Obstetrics & Gynecology

## 2022-03-20 ENCOUNTER — Ambulatory Visit (INDEPENDENT_AMBULATORY_CARE_PROVIDER_SITE_OTHER): Payer: Medicaid Other | Admitting: Obstetrics & Gynecology

## 2022-03-20 VITALS — BP 116/72 | HR 78 | Resp 14 | Ht 68.0 in | Wt 239.0 lb

## 2022-03-20 DIAGNOSIS — Z01419 Encounter for gynecological examination (general) (routine) without abnormal findings: Secondary | ICD-10-CM | POA: Diagnosis present

## 2022-03-20 DIAGNOSIS — Z3009 Encounter for other general counseling and advice on contraception: Secondary | ICD-10-CM | POA: Diagnosis not present

## 2022-03-20 DIAGNOSIS — Z6836 Body mass index (BMI) 36.0-36.9, adult: Secondary | ICD-10-CM

## 2022-03-20 MED ORDER — MOUNJARO 7.5 MG/0.5ML ~~LOC~~ SOAJ
SUBCUTANEOUS | 5 refills | Status: DC
  Filled 2022-03-20: qty 2, 28d supply, fill #0
  Filled 2022-04-16: qty 2, 28d supply, fill #1

## 2022-03-20 NOTE — Progress Notes (Signed)
KRISTON MCKINNIE 07-Jul-1976 [id number]   History:    46 y.o. G0 Single.  Presented with her mother   RP:  Established patient presenting for annual gyn exam    HPI: H/O Head trauma/Hydrocephaly.  Followed by Dr Forde Dandy for Hypothyroidism/Hyperprolactinemia/DM type 2.  Menstrual periods regular every month.  No pelvic pain.  Normal vaginal secretions. Abstinent. Pap Neg 11/2017.  Pap reflex today.  No h/o abnormal Pap.  Treated for urinary urgency, on Myrbetriq.  Bowel movements normal.  Breasts normal.  Mammo Neg 07/2021.  Body mass index improved to 36.34.  Colono 02/2017.   Past medical history,surgical history, family history and social history were all reviewed and documented in the EPIC chart.  Gynecologic History Patient's last menstrual period was 03/11/2022.  Obstetric History OB History  Gravida Para Term Preterm AB Living  0 0 0 0 0 0  SAB IAB Ectopic Multiple Live Births  0 0 0 0 0     ROS: A ROS was performed and pertinent positives and negatives are included in the history. GENERAL: No fevers or chills. HEENT: No change in vision, no earache, sore throat or sinus congestion. NECK: No pain or stiffness. CARDIOVASCULAR: No chest pain or pressure. No palpitations. PULMONARY: No shortness of breath, cough or wheeze. GASTROINTESTINAL: No abdominal pain, nausea, vomiting or diarrhea, melena or bright red blood per rectum. GENITOURINARY: No urinary frequency, urgency, hesitancy or dysuria. MUSCULOSKELETAL: No joint or muscle pain, no back pain, no recent trauma. DERMATOLOGIC: No rash, no itching, no lesions. ENDOCRINE: No polyuria, polydipsia, no heat or cold intolerance. No recent change in weight. HEMATOLOGICAL: No anemia or easy bruising or bleeding. NEUROLOGIC: No headache, seizures, numbness, tingling or weakness. PSYCHIATRIC: No depression, no loss of interest in normal activity or change in sleep pattern.     Exam:   BP 116/72 (BP Location: Right Arm, Patient Position:  Sitting, Cuff Size: Normal)   Pulse 78   Resp 14   Ht '5\' 8"'$  (1.727 m)   Wt 239 lb (108.4 kg)   LMP 03/11/2022   BMI 36.34 kg/m   Body mass index is 36.34 kg/m.  General appearance : Well developed well nourished female. No acute distress HEENT: Eyes: no retinal hemorrhage or exudates,  Neck supple, trachea midline, no carotid bruits, no thyroidmegaly Lungs: Clear to auscultation, no rhonchi or wheezes, or rib retractions  Heart: Regular rate and rhythm, no murmurs or gallops Breast:Examined in sitting and supine position were symmetrical in appearance, no palpable masses or tenderness,  no skin retraction, no nipple inversion, no nipple discharge, no skin discoloration, no axillary or supraclavicular lymphadenopathy Abdomen: no palpable masses or tenderness, no rebound or guarding Extremities: no edema or skin discoloration or tenderness  Pelvic: Vulva: Normal             Vagina: No gross lesions or discharge  Cervix: No gross lesions or discharge.  Pap reflex done  Uterus  AV, normal size, shape and consistency, non-tender and mobile  Adnexa  Without masses or tenderness  Anus: Normal   Assessment/Plan:  46 y.o. female for annual exam   1. Encounter for routine gynecological examination with Papanicolaou smear of cervix H/O Head trauma/Hydrocephaly.  Followed by Dr Forde Dandy for Hypothyroidism/Hyperprolactinemia/DM type 2.  Menstrual periods regular every month.  No pelvic pain.  Normal vaginal secretions. Abstinent. Pap Neg 11/2017.  Pap reflex today.  No h/o abnormal Pap.  Treated for urinary urgency, on Myrbetriq.  Bowel movements normal.  Breasts normal.  Mammo Neg 07/2021.  Body mass index improved to 36.34.  Colono 02/2017. - Cytology - PAP( [facility])  2. Encounter for other general counseling or advice on contraception Currently abstinent.  3. Class 2 severe obesity due to excess calories with serious comorbidity and body mass index (BMI) of 36.0 to 36.9 in adult [facility])   Body mass index improved to 36.34.  Increased physical activity.  Lower calorie/carb diet.  Princess Bruins MD, 2:52 PM 03/20/2022

## 2022-03-22 LAB — CYTOLOGY - PAP: Diagnosis: NEGATIVE

## 2022-04-16 ENCOUNTER — Other Ambulatory Visit: Payer: Self-pay

## 2022-04-17 ENCOUNTER — Other Ambulatory Visit: Payer: Self-pay

## 2022-04-20 ENCOUNTER — Ambulatory Visit (INDEPENDENT_AMBULATORY_CARE_PROVIDER_SITE_OTHER): Payer: Medicaid Other | Admitting: Primary Care

## 2022-04-20 ENCOUNTER — Encounter: Payer: Self-pay | Admitting: Primary Care

## 2022-04-20 VITALS — BP 102/84 | HR 68 | Temp 98.6°F | Resp 16 | Ht 68.0 in | Wt 230.0 lb

## 2022-04-20 DIAGNOSIS — R142 Eructation: Secondary | ICD-10-CM

## 2022-04-20 DIAGNOSIS — R197 Diarrhea, unspecified: Secondary | ICD-10-CM | POA: Diagnosis not present

## 2022-04-20 LAB — BASIC METABOLIC PANEL
BUN: 17 mg/dL (ref 6–23)
CO2: 24 mEq/L (ref 19–32)
Calcium: 9.2 mg/dL (ref 8.4–10.5)
Chloride: 107 mEq/L (ref 96–112)
Creatinine, Ser: 0.88 mg/dL (ref 0.40–1.20)
GFR: 79.78 mL/min (ref >60.00)
Glucose, Bld: 116 mg/dL — ABNORMAL HIGH (ref 70–99)
Potassium: 3.8 mEq/L (ref 3.5–5.1)
Sodium: 140 mEq/L (ref 135–145)

## 2022-04-20 LAB — CBC WITH DIFFERENTIAL/PLATELET
Basophils Absolute: 0 10*3/uL (ref 0.0–0.1)
Basophils Relative: 0.4 % (ref 0.0–3.0)
Eosinophils Absolute: 0.3 10*3/uL (ref 0.0–0.7)
Eosinophils Relative: 3.5 % (ref 0.0–5.0)
HCT: 40 % (ref 36.0–46.0)
Hemoglobin: 13.3 g/dL (ref 12.0–15.0)
Lymphocytes Relative: 26.3 % (ref 12.0–46.0)
Lymphs Abs: 2.5 10*3/uL (ref 0.7–4.0)
MCHC: 33.2 g/dL (ref 30.0–36.0)
MCV: 89.9 fl (ref 78.0–100.0)
Monocytes Absolute: 0.7 10*3/uL (ref 0.1–1.0)
Monocytes Relative: 6.9 % (ref 3.0–12.0)
Neutro Abs: 6.1 10*3/uL (ref 1.4–7.7)
Neutrophils Relative %: 62.9 % (ref 43.0–77.0)
Platelets: 289 10*3/uL (ref 150.0–400.0)
RBC: 4.45 Mil/uL (ref 3.87–5.11)
RDW: 13.6 % (ref 11.5–15.5)
WBC: 9.6 10*3/uL (ref 4.0–10.5)

## 2022-04-20 NOTE — Progress Notes (Signed)
Established Patient Office Visit  Subjective   Patient ID: Christine Koch, female    DOB: 02/02/1976  Age: 46 y.o. MRN: [id number]  Chief Complaint  Patient presents with   Diarrhea    X 3 days   Hemorrhoids    Hurt X 2 days    Diarrhea  Pertinent negatives include no chills or fever.    Christine Koch is a 46 year old female with a past medical history of TBI, hemorrhoids and migraines presents today for diarrhea and hemorrhoids.   She has a history of constipation but for the last three days she has been experiencing loose stools. Her symptoms started on Tuesday. She usually has one BM per day. Her stools are soft and loose in consistency. She reports 4-5 BM's per day and has to get up 1-2 BM's at night. She usually takes Linzess 290 mcg every other day. Her mother states her last dose of Linzess on Tuesday. She usually takes Fiber gummy and her last dose was on Wednesday morning. She reports gas and bloating. She reports foul smelling belching. She has nausea intermittently but denies any vomiting. She took Zofran Tuesday morning. Her mother is concerned about dehydration. She was on Augmentin for seven days at the end of June for pneumonia. She denies any fever or chills.  She is on Mounjaro 7.5 mg weekly. Her last dose was on Sunday.   She is using preparation H for hemorrhoids. She used tucks last night. She just started this today.     Review of Systems  Constitutional:  Positive for malaise/fatigue. Negative for chills and fever.  Respiratory:  Negative for shortness of breath and wheezing.   Cardiovascular:  Negative for chest pain and palpitations.  Gastrointestinal:  Positive for diarrhea. Negative for blood in stool.       Belching, gas and bloating  Neurological:  Negative for dizziness.  Psychiatric/Behavioral:  The patient is not nervous/anxious.       Objective:     BP 102/84   Pulse 68   Temp 98.6 F (37 C)   Resp 16   Ht '5\' 8"'$  (1.727 m)   Wt  230 lb (104.3 kg)   LMP 04/10/2022 (Approximate)   SpO2 98%   BMI 34.97 kg/m  BP Readings from Last 3 Encounters:  04/20/22 102/84  03/20/22 116/72  02/24/22 102/78      Physical Exam Vitals and nursing note reviewed.  Cardiovascular:     Rate and Rhythm: Normal rate and regular rhythm.     Pulses: Normal pulses.     Heart sounds: Normal heart sounds.  Pulmonary:     Effort: Pulmonary effort is normal.     Breath sounds: Normal breath sounds.  Abdominal:     General: Bowel sounds are normal. There is no distension.     Palpations: Abdomen is soft.     Tenderness: There is no guarding.     Comments: Belching, Hemorrhoids  Neurological:     Mental Status: She is oriented to person, place, and time.  Psychiatric:        Mood and Affect: Mood normal.        Behavior: Behavior normal.      No results found for any visits on 04/20/22.   The ASCVD Risk score (Arnett DK, et al., 2019) failed to calculate for the following reasons:   The patient has a prior MI or stroke diagnosis    Assessment & Plan:   Problem List  Items Addressed This Visit       Other   Diarrhea - Primary    Bowel sounds present. Suspected gastroenteritis.   CBC with diff pending to rule out infection. BMP pending to check kidney functions and rule out dehydration.   GI panel pending for further evaluation.   Will continue to monitor.      Relevant Orders   Basic Metabolic Panel   CBC with Differential   Giardia antigen   Gastrointestinal Pathogen Pnl RT, PCR   C. difficile GDH and Toxin A/B   Belching    Uncontrolled. Suspected gastroenteritis.  Stool studies pending. CBC and BMP pending.  Take OTC pepcid PRN for belching.        No follow-ups on file.    Tinnie Gens, BSN-RN, DNP STUDENT

## 2022-04-20 NOTE — Progress Notes (Signed)
Subjective:    Patient ID: Christine Koch, female    DOB: [birth year], 46 y.o.   MRN: [id number]  Diarrhea  Pertinent negatives include no abdominal pain, chills, fever or vomiting.    Christine Koch is a very pleasant 46 y.o. female with a history of hemorrhoids, constipation, type 2 diabetes, traumatic brain injury who presents today to discuss frequent bowel movements. Her mother joins [place] today to help provide information for HPI.  Over the last three days her bowel movements have increased. She is experiencing soft/liquid, frequent stools at a rate of 4-5 movements daily, and 1-2 movements during the night. Also with increased gas, belching, nausea without vomiting, and not feeling well. She is managed on Linzess 290 mcg every other day and fiber gummies daily. Her mother discontinued her Linzess and fiber gummies upon symptom onset.   She is also managed on Mounjaro 7.5 mg once weekly, last dose was two days prior to symptom onset. She has been on Mounjaro 7.5 mg for about 6 weeks. She is also managed on metformin 500 mg in AM and 1000 mg in PM for which she's been taking for years. She was treated for a presumed pneumonia with a seven day course of Augmentin on 02/24/22. No antibiotics since.   Her mother has been applying preparation H for her hemorrhoids as of today. No bowel movement today. She denies fevers, body aches, abdominal pain.     Review of Systems  Constitutional:  Positive for fatigue. Negative for chills and fever.  Respiratory:  Negative for shortness of breath.   Gastrointestinal:  Positive for diarrhea and nausea. Negative for abdominal pain, constipation and vomiting.         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)    Depression    Dermatophytosis of nail    Diabetes (HCC)    H/O: pneumonia    Headache    Impacted cerumen    Internal hemorrhoids without mention of  complication    Other acute otitis externa    Thyroid disease    Unspecified cerebral artery occlusion with cerebral infarction 1995    Social History   Socioeconomic History   Marital status: Single    Spouse name: Not on file   Number of children: Not on file   Years of education: Not on file   Highest education level: Not on file  Occupational History   Not on file  Tobacco Use   Smoking status: Never   Smokeless tobacco: Never  Vaping Use   Vaping Use: Never used  Substance and Sexual Activity   Alcohol use: Not Currently    Alcohol/week: 0.0 standard drinks of alcohol   Drug use: Not Currently   Sexual activity: Not Currently    Birth control/protection: Abstinence    Comment: intercourse age 10 , less than 5 sexual partners  Other Topics Concern   Not on file  Social History Narrative   Not on file   Social Determinants of Health   Financial Resource Strain: Not on file  Food Insecurity: Not on file  Transportation Needs: Not on file  Physical Activity: Not on file  Stress: Not on file  Social Connections: Not on file  Intimate Partner Violence: Not on file    Past Surgical History:  Procedure Laterality Date   [place]  1995   Pituitary tumor   VENTRICULO-PERITONEAL SHUNT PLACEMENT / LAPAROSCOPIC INSERTION PERITONEAL CATHETER  1995   for hydrocephaly    Family History  Problem Relation Age of Onset   Diabetes Mother    Hypertension Mother    Colon polyps Mother    Diabetes Maternal Aunt    Diabetes Maternal Grandmother    Hypertension Maternal Grandmother    Heart disease Maternal Grandmother    Cancer Maternal Grandfather        unknown type? possibly lung cancer   Diabetes Paternal Grandmother    Colon cancer Neg Hx    Stomach cancer Neg Hx     Allergies  Allergen Reactions   Codeine     REACTION: Vomiting    Current Outpatient Medications on File Prior to Visit   Medication Sig Dispense Refill   cabergoline (DOSTINEX) 0.5 MG tablet Take 0.25 mg by mouth 2 (two) times a week. Mondays and Thursdays     DULoxetine (CYMBALTA) 30 MG capsule Take 30 mg by mouth daily.     fluticasone (FLONASE) 50 MCG/ACT nasal spray Place into both nostrils daily as needed.      levothyroxine (SYNTHROID, LEVOTHROID) 175 MCG tablet Take 175 mcg by mouth daily.     linaclotide (LINZESS) 290 MCG CAPS capsule Take 290 mcg by mouth every other day.     metFORMIN (GLUCOPHAGE) 500 MG tablet Take 1,000 mg by mouth 2 (two) times daily with a meal. One in the am and two in pm     MOUNJARO 7.5 MG/0.5ML Pen 7.5 Milligram(s) SUB-Q Once a Week     MYRBETRIQ 50 MG TB24 tablet Take 50 mg by mouth daily.     naproxen (NAPROSYN) 500 MG tablet Take 500 mg by mouth daily as needed.     rosuvastatin (CRESTOR) 10 MG tablet Take 10 mg by mouth 2 (two) times a week. Mondays and Thursdays     tirzepatide [facility]) 7.5 MG/0.5ML Pen Inject 7.'5mg'$  into the skin once weekly 2 mL 5   topiramate (TOPAMAX) 50 MG tablet Take 50 mg by mouth 2 (two) times daily.     UNABLE TO FIND Med Name: fiber gummies     Vitamin D, Ergocalciferol, (DRISDOL) 50000 units CAPS capsule Take 5,000 Units by mouth once a week.  6   No current facility-administered medications on file prior to visit.    BP 102/84   Pulse 68   Temp 98.6 F (37 C)   Resp 16   Ht '5\' 8"'$  (1.727 m)   Wt 230 lb (104.3 kg)   LMP 04/10/2022 (Approximate)   SpO2 98%   BMI 34.97 kg/m  Objective:   Physical Exam Constitutional:      Comments: Appears tired and not feeling well   Cardiovascular:     Rate and Rhythm: Normal rate and regular rhythm.  Pulmonary:     Effort: Pulmonary effort is normal.     Breath sounds: Normal breath sounds.  Abdominal:     General: Bowel sounds are normal.     Palpations: Abdomen is soft.     Tenderness: There is no abdominal tenderness.  Musculoskeletal:     Cervical back: Neck supple.  Skin:     General: Skin is warm and dry.           Assessment & Plan:   Problem List Items Addressed This Visit       Cardiovascular and Mediastinum   Internal hemorrhoids     Other  Diarrhea - Primary    Unclear etiology, could be viral based on presentation. Fortunately, bowel sounds present throughout which lowers suspicion for obstruction.  CBC with diff pending to rule out infection. BMP pending to check kidney functions and evaluate for dehydration.   GI panel pending.  Continue Preparation H PRN for hemorrhoids   Return precautions provided.  Encouraged hydration.  Remain off Linzess and fiber.   I evaluated patient, was consulted regarding treatment, and agree with assessment and plan per Tinnie Gens, RN, DNP student.   Allie Bossier, NP-C       Relevant Orders   Basic Metabolic Panel   CBC with Differential   Giardia antigen   Gastrointestinal Pathogen Pnl RT, PCR   C. difficile GDH and Toxin A/B   Belching    GERD vs viral GI infection.   Stool studies pending. CBC and BMP pending.  Take OTC pepcid PRN for belching.  I evaluated patient, was consulted regarding treatment, and agree with assessment and plan per Tinnie Gens, RN, DNP student.   Allie Bossier, NP-C           Pleas Koch, NP

## 2022-04-20 NOTE — Assessment & Plan Note (Deleted)
Uncontrolled.  Continue OTC Preparation H and tucks.   Will continue to monitor.

## 2022-04-20 NOTE — Assessment & Plan Note (Addendum)
GERD vs viral GI infection.   Stool studies pending. CBC and BMP pending.  Take OTC pepcid PRN for belching.  I evaluated patient, was consulted regarding treatment, and agree with assessment and plan per Tinnie Gens, RN, DNP student.   Allie Bossier, NP-C

## 2022-04-20 NOTE — Assessment & Plan Note (Addendum)
Unclear etiology, could be viral based on presentation. Fortunately, bowel sounds present throughout which lowers suspicion for obstruction.  CBC with diff pending to rule out infection. BMP pending to check kidney functions and evaluate for dehydration.   GI panel pending.  Continue Preparation H PRN for hemorrhoids   Return precautions provided.  Encouraged hydration.  Remain off Linzess and fiber.   I evaluated patient, was consulted regarding treatment, and agree with assessment and plan per Tinnie Gens, RN, DNP student.   Allie Bossier, NP-C

## 2022-04-20 NOTE — Patient Instructions (Signed)
Stop by the lab prior to leaving today. I will notify you of your results once received.    Please return stool kit as soon as possible.   You can try pepcid OTC for the belching.   We will be in touch with results.   It was a pleasure to see you today!

## 2022-04-28 ENCOUNTER — Ambulatory Visit (INDEPENDENT_AMBULATORY_CARE_PROVIDER_SITE_OTHER)
Admission: RE | Admit: 2022-04-28 | Discharge: 2022-04-28 | Disposition: A | Discharge status: Home / Self Care | Payer: Medicaid Other | Source: Ambulatory Visit | Attending: Nurse Practitioner | Admitting: Nurse Practitioner

## 2022-04-28 ENCOUNTER — Ambulatory Visit (INDEPENDENT_AMBULATORY_CARE_PROVIDER_SITE_OTHER): Payer: Medicaid Other | Admitting: Nurse Practitioner

## 2022-04-28 VITALS — BP 108/62 | HR 90 | Temp 96.8°F | Resp 16 | Ht 68.0 in | Wt 235.0 lb

## 2022-04-28 DIAGNOSIS — K5900 Constipation, unspecified: Secondary | ICD-10-CM

## 2022-04-28 DIAGNOSIS — W19XXXA Unspecified fall, initial encounter: Secondary | ICD-10-CM | POA: Diagnosis not present

## 2022-04-28 DIAGNOSIS — R1084 Generalized abdominal pain: Secondary | ICD-10-CM | POA: Diagnosis not present

## 2022-04-28 NOTE — Patient Instructions (Signed)
Nice to see you today I will be in touch with the labs and xray results once I have them Follow up if no improvement

## 2022-04-28 NOTE — Assessment & Plan Note (Signed)
Ambiguous in nature nontender to palpation.  We will check basic labs including lipase this patient is on Mounjaro to rule out an occult pancreatitis.  Also do breath test for H. pylori this patient is having "sour burps".

## 2022-04-28 NOTE — Assessment & Plan Note (Signed)
Mechanical fall.  Some ecchymosis noted.  Some tenderness to palpation.  No suspicion for broken bones patient has range of motion in joints.  Neurological exam intact at patient's baseline.

## 2022-04-28 NOTE — Assessment & Plan Note (Signed)
Patient has not had a good bowel movement in 7 days.  Did have a partial bowel movement a couple days ago.  I was after using an enema.  Recently started on Linzess 290 mcg.  Not no great relief thus far.  Order abdomen 2 view, pending result continue taking Linzess as prescribed from primary care

## 2022-04-28 NOTE — Progress Notes (Signed)
Acute Office Visit  Subjective:     Patient ID: Christine Koch, female    DOB: [birth year], 46 y.o.   MRN: [id number]  Chief Complaint  Patient presents with   Fall    On 04/24/22-fell backwards out of the chair at work, hit her head on the book case, has bruising left shoulder, left back and left arm. Very sore on the back of her head.   Constipation    Had only 1 bowel movement with an enema in 7 days. Abdominal pain at times, she does pass gas. No fever.     Patient is in today for multiple complaints  Mother at bedside, she helped with history  Fall: States that it happened on 04/24/2022. She was working down stairs in the basement. States she was in an office style chair. She went to stand up and she fell. States that she does not know if she went forward or backwards. States that she just remembers she was on the floor. NO loc.  States she hurts on her left arm, leg left shoulder and thoracic back   States that she fell backwards and her head on bookshelf. No neuro deficits. Per mothers report.  Constipation : States she was seen in office on 08/24/2023for feeling ill and having lots of BMs. Stool sample stool but has not been able to poop since. States that she was given an enema 2 days ago. Had a small bm. Having sour burps. She did just start linzess on Tuesday daily. Currenlty on maunjaro for at least a couple month.  Patient and mother denies patient having any abdominal surgeries minus for shunt placement.  No history of small bowel obstructions.  No history of H. pylori.  Review of Systems  Constitutional:  Negative for chills and fever.  Gastrointestinal:  Positive for abdominal pain, constipation and nausea (intermittent). Negative for vomiting.  Genitourinary:  Negative for dysuria and hematuria.  Neurological:  Negative for dizziness, tingling and headaches.        Objective:    BP 108/62   Pulse 90   Temp (!) 96.8 F (36 C)   Resp 16   Ht '5\' 8"'$  (1.727  m)   Wt 235 lb (106.6 kg)   LMP 04/10/2022 (Approximate)   SpO2 97%   BMI 35.73 kg/m    Physical Exam Vitals and nursing note reviewed.  Constitutional:      Appearance: Normal appearance.  HENT:     Head:      Mouth/Throat:     Mouth: Mucous membranes are moist.     Pharynx: Oropharynx is clear.  Eyes:     Extraocular Movements: Extraocular movements intact.     Pupils: Pupils are equal, round, and reactive to light.     Comments: Wears glasses  Cardiovascular:     Rate and Rhythm: Normal rate and regular rhythm.  Abdominal:     General: Bowel sounds are normal. There is no distension.     Palpations: There is no mass.     Tenderness: There is no abdominal tenderness.     Hernia: No hernia is present.  Musculoskeletal:     Left shoulder: Tenderness present. No bony tenderness.     Cervical back: No swelling, deformity, tenderness or bony tenderness.     Comments: Decreased abduction on left arm  Lymphadenopathy:     Cervical: No cervical adenopathy.  Skin:    Findings: Bruising present.       Neurological:  Mental Status: She is alert. Mental status is at baseline.     Motor: Motor function is intact. No weakness.     Coordination: Finger-Nose-Finger Test normal.     Deep Tendon Reflexes:     Reflex Scores:      Bicep reflexes are 2+ on the right side and 2+ on the left side.      Patellar reflexes are 2+ on the right side and 2+ on the left side.    Comments: Bilateral upper and lower extremity strength 5/5     No results found for any visits on 04/28/22.      Assessment & Plan:   Problem List Items Addressed This Visit       Other   Fall    Mechanical fall.  Some ecchymosis noted.  Some tenderness to palpation.  No suspicion for broken bones patient has range of motion in joints.  Neurological exam intact at patient's baseline.      Constipation    Patient has not had a good bowel movement in 7 days.  Did have a partial bowel movement a couple  days ago.  I was after using an enema.  Recently started on Linzess 290 mcg.  Not no great relief thus far.  Order abdomen 2 view, pending result continue taking Linzess as prescribed from primary care      Relevant Orders   DG Abd 2 Views (Completed)   Generalized abdominal pain - Primary    Ambiguous in nature nontender to palpation.  We will check basic labs including lipase this patient is on Mounjaro to rule out an occult pancreatitis.  Also do breath test for H. pylori this patient is having "sour burps".      Relevant Orders   DG Abd 2 Views (Completed)   COMPLETE METABOLIC PANEL WITH GFR   H. pylori breath test   CBC   Lipase    No orders of the defined types were placed in this encounter.   Return if symptoms worsen or fail to improve.  Romilda Garret, NP

## 2022-05-02 ENCOUNTER — Other Ambulatory Visit: Payer: Self-pay | Admitting: Nurse Practitioner

## 2022-05-02 DIAGNOSIS — R1084 Generalized abdominal pain: Secondary | ICD-10-CM

## 2022-05-02 MED ORDER — OMEPRAZOLE 20 MG PO CPDR: 20.0000 mg | DELAYED_RELEASE_CAPSULE | Freq: Every day | ORAL | 2 refills | Status: DC

## 2022-05-03 LAB — CBC
HCT: 37.1 % (ref 35.0–45.0)
Hemoglobin: 12.6 g/dL (ref 11.7–15.5)
MCH: 30.4 pg (ref 27.0–33.0)
MCHC: 34 g/dL (ref 32.0–36.0)
MCV: 89.4 fL (ref 80.0–100.0)
MPV: 10 fL (ref 7.5–12.5)
Platelets: 253 10*3/uL (ref 140–400)
RBC: 4.15 10*6/uL (ref 3.80–5.10)
RDW: 12.9 % (ref 11.0–15.0)
WBC: 9 10*3/uL (ref 3.8–10.8)

## 2022-05-03 LAB — COMPLETE METABOLIC PANEL WITH GFR
AG Ratio: 1.6 (calc) (ref 1.0–2.5)
ALT: 23 U/L (ref 6–29)
AST: 19 U/L (ref 10–30)
Albumin: 4.2 g/dL (ref 3.6–5.1)
Alkaline phosphatase (APISO): 72 U/L (ref 31–125)
BUN: 15 mg/dL (ref 7–25)
CO2: 22 mmol/L (ref 20–32)
Calcium: 9.1 mg/dL (ref 8.6–10.2)
Chloride: 108 mmol/L (ref 98–110)
Creat: 0.81 mg/dL (ref 0.50–0.99)
Globulin: 2.6 g/dL (calc) (ref 1.9–3.7)
Glucose, Bld: 121 mg/dL — ABNORMAL HIGH (ref 65–99)
Potassium: 3.6 mmol/L (ref 3.5–5.3)
Sodium: 140 mmol/L (ref 135–146)
Total Bilirubin: 0.2 mg/dL (ref 0.2–1.2)
Total Protein: 6.8 g/dL (ref 6.1–8.1)
eGFR: 92 mL/min/{1.73_m2} (ref >=60)

## 2022-05-03 LAB — LIPASE: Lipase: 54 U/L (ref 7–60)

## 2022-05-03 LAB — H. PYLORI BREATH TEST: H. pylori Breath Test: NOT DETECTED

## 2022-05-28 ENCOUNTER — Other Ambulatory Visit: Payer: Self-pay | Admitting: Obstetrics & Gynecology

## 2022-06-08 ENCOUNTER — Ambulatory Visit (INDEPENDENT_AMBULATORY_CARE_PROVIDER_SITE_OTHER): Payer: Medicaid Other

## 2022-06-08 DIAGNOSIS — Z23 Encounter for immunization: Secondary | ICD-10-CM

## 2022-06-27 ENCOUNTER — Other Ambulatory Visit: Payer: Self-pay | Admitting: Obstetrics & Gynecology

## 2022-08-28 ENCOUNTER — Other Ambulatory Visit: Payer: Self-pay | Admitting: Nurse Practitioner

## 2022-08-28 DIAGNOSIS — R1084 Generalized abdominal pain: Secondary | ICD-10-CM

## 2022-10-06 ENCOUNTER — Other Ambulatory Visit: Payer: Self-pay | Admitting: Nurse Practitioner

## 2022-10-06 ENCOUNTER — Other Ambulatory Visit: Payer: Self-pay | Admitting: Obstetrics & Gynecology

## 2022-10-06 DIAGNOSIS — N762 Acute vulvitis: Secondary | ICD-10-CM

## 2022-10-06 DIAGNOSIS — R1084 Generalized abdominal pain: Secondary | ICD-10-CM

## 2022-10-06 NOTE — Telephone Encounter (Signed)
RF request received for clobetasol 0.05% ointment.  Last AEX 03/20/22 with Dr. Dellis Filbert.  Last Rx'd 01/20/22 for acute vulvitis.  LM for patient's mom (on DPR) tcb to see if patient is having symptoms.

## 2022-10-26 ENCOUNTER — Other Ambulatory Visit: Payer: Self-pay | Admitting: Obstetrics & Gynecology

## 2022-10-26 ENCOUNTER — Other Ambulatory Visit: Payer: Self-pay | Admitting: Nurse Practitioner

## 2022-10-26 DIAGNOSIS — R1084 Generalized abdominal pain: Secondary | ICD-10-CM

## 2022-11-20 ENCOUNTER — Other Ambulatory Visit: Payer: Self-pay | Admitting: Obstetrics & Gynecology

## 2022-11-20 ENCOUNTER — Other Ambulatory Visit: Payer: Self-pay | Admitting: Nurse Practitioner

## 2022-11-20 DIAGNOSIS — R1084 Generalized abdominal pain: Secondary | ICD-10-CM

## 2022-11-22 ENCOUNTER — Other Ambulatory Visit: Payer: Self-pay | Admitting: Obstetrics & Gynecology

## 2022-11-22 DIAGNOSIS — Z1231 Encounter for screening mammogram for malignant neoplasm of breast: Secondary | ICD-10-CM

## 2022-12-11 ENCOUNTER — Other Ambulatory Visit: Payer: Self-pay | Admitting: Obstetrics & Gynecology

## 2022-12-27 ENCOUNTER — Ambulatory Visit
Admission: RE | Admit: 2022-12-27 | Discharge: 2022-12-27 | Disposition: A | Discharge status: Home / Self Care | Payer: Medicaid Other | Source: Ambulatory Visit | Attending: Obstetrics & Gynecology | Admitting: Obstetrics & Gynecology

## 2022-12-27 DIAGNOSIS — Z1231 Encounter for screening mammogram for malignant neoplasm of breast: Secondary | ICD-10-CM

## 2023-01-04 ENCOUNTER — Other Ambulatory Visit: Payer: Self-pay | Admitting: Obstetrics & Gynecology

## 2023-01-04 DIAGNOSIS — N762 Acute vulvitis: Secondary | ICD-10-CM

## 2023-01-04 NOTE — Telephone Encounter (Signed)
RF request received for clobetasol ointment 0.05%. RX d/c'd 03/20/22. Last RF 01/20/22 AEX 03/20/22 AEX scheduled 03/23/23 RF denied.

## 2023-01-30 ENCOUNTER — Other Ambulatory Visit: Payer: Self-pay | Admitting: Nurse Practitioner

## 2023-01-30 DIAGNOSIS — R1084 Generalized abdominal pain: Secondary | ICD-10-CM

## 2023-03-23 ENCOUNTER — Ambulatory Visit: Payer: Medicaid Other | Admitting: Obstetrics & Gynecology

## 2023-05-17 ENCOUNTER — Ambulatory Visit (INDEPENDENT_AMBULATORY_CARE_PROVIDER_SITE_OTHER): Payer: MEDICAID | Admitting: Obstetrics and Gynecology

## 2023-05-17 ENCOUNTER — Encounter: Payer: Self-pay | Admitting: Obstetrics and Gynecology

## 2023-05-17 VITALS — BP 110/74 | HR 84 | Ht 67.5 in | Wt 230.0 lb

## 2023-05-17 DIAGNOSIS — R232 Flushing: Secondary | ICD-10-CM | POA: Diagnosis not present

## 2023-05-17 DIAGNOSIS — Z01419 Encounter for gynecological examination (general) (routine) without abnormal findings: Secondary | ICD-10-CM | POA: Diagnosis not present

## 2023-05-17 NOTE — Progress Notes (Signed)
47 y.o. y.o. female here for annual exam. She is frustrated with her periods and reports they last for 7 days a month. She has suffered an ischemic stroke during surgery when her carotid vessel was nicked in surgery.  She is not sexually active and does not want to be pregnant.  Her mother is her care taker and works in [employer] She is nervous about side effects of hormones and does not want to take any  Blood pressure 110/74, pulse 84, height 5' 7.5" (1.715 m), weight 230 lb (104.3 kg), last menstrual period 04/30/2023, SpO2 97%.     Component Value Date/Time   DIAGPAP  03/20/2022 1507    - Negative for intraepithelial lesion or malignancy (NILM)   ADEQPAP  03/20/2022 1507    Satisfactory for evaluation; transformation zone component PRESENT.    GYN HISTORY:    Component Value Date/Time   DIAGPAP  03/20/2022 1507    - Negative for intraepithelial lesion or malignancy (NILM)   ADEQPAP  03/20/2022 1507    Satisfactory for evaluation; transformation zone component PRESENT.    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)    Depression    Dermatophytosis of nail    Diabetes (HCC)    H/O: pneumonia    Headache    Impacted cerumen    Internal hemorrhoids without mention of complication    Other acute otitis externa    Thyroid disease    Unspecified cerebral artery occlusion with cerebral infarction 1995    Past Surgical History:  Procedure Laterality Date   ADENOIDECTOMY  1979   MYRINGOTOMY  1979   TONSILLECTOMY  1993   TUMOR REMOVAL  1995   Pituitary tumor   VENTRICULO-PERITONEAL SHUNT PLACEMENT / LAPAROSCOPIC INSERTION PERITONEAL CATHETER  1995   for hydrocephaly    Current Outpatient Medications on File Prior to Visit  Medication Sig Dispense Refill   cabergoline  (DOSTINEX) 0.5 MG tablet Take 0.25 mg by mouth 2 (two) times a week. Mondays and Thursdays     DULoxetine (CYMBALTA) 30 MG capsule Take 30 mg by mouth daily.     fluticasone (FLONASE) 50 MCG/ACT nasal spray Place into both nostrils daily as needed.      levothyroxine (SYNTHROID, LEVOTHROID) 175 MCG tablet Take 175 mcg by mouth daily.     linaclotide (LINZESS) 290 MCG CAPS capsule Take 290 mcg by mouth daily.     metFORMIN (GLUCOPHAGE) 500 MG tablet Take 1,000 mg by mouth 2 (two) times daily with a meal. One in the am and two in pm     MYRBETRIQ 50 MG TB24 tablet Take 50 mg by mouth daily.     naproxen (NAPROSYN) 500 MG tablet Take 500 mg by mouth daily as needed.     OZEMPIC, 2 MG/DOSE, 8 MG/3ML SOPN Inject into the skin.     rosuvastatin (CRESTOR) 10 MG tablet Take 10 mg by mouth 2 (two) times a week. Mondays and Thursdays     topiramate (TOPAMAX) 50 MG tablet Take 50 mg by mouth 2 (two) times daily.     Vitamin D, Ergocalciferol, (DRISDOL) 50000 units CAPS capsule Take 5,000 Units by mouth once a week.  6   No current facility-administered medications on file prior  to visit.    Social History   Socioeconomic History   Marital status: Single    Spouse name: Not on file   Number of children: Not on file   Years of education: Not on file   Highest education level: Not on file  Occupational History   Not on file  Tobacco Use   Smoking status: Never   Smokeless tobacco: Never  Vaping Use   Vaping status: Never Used  Substance and Sexual Activity   Alcohol use: Not Currently    Alcohol/week: 0.0 standard drinks of alcohol   Drug use: Not Currently   Sexual activity: Not Currently    Birth control/protection: Abstinence    Comment: intercourse age 47 , less than 5 sexual partners  Other Topics Concern   Not on file  Social History Narrative   Not on file   Social Determinants of Health   Financial Resource Strain: Not on file  Food Insecurity: Not on file  Transportation  Needs: Not on file  Physical Activity: Not on file  Stress: Not on file  Social Connections: Not on file  Intimate Partner Violence: Not on file    Family History  Problem Relation Age of Onset   Diabetes Mother    Hypertension Mother    Colon polyps Mother    Diabetes Maternal Aunt    Diabetes Maternal Grandmother    Hypertension Maternal Grandmother    Heart disease Maternal Grandmother    Cancer Maternal Grandfather        unknown type? possibly lung cancer   Diabetes Paternal Grandmother    Colon cancer Neg Hx    Stomach cancer Neg Hx      Allergies  Allergen Reactions   Codeine     REACTION: Vomiting      Patient's last menstrual period was Patient's last menstrual period was 04/30/2023 (exact date)..             Review of Systems Alls systems reviewed and are negative.     PE General appearance: alert, cooperative and appears stated age Head: Normocephalic, without obvious abnormality, atraumatic Neck: no adenopathy, supple, symmetrical, trachea midline and thyroid normal to inspection and palpation Lungs: clear to auscultation bilaterally Breasts: normal appearance, no masses or tenderness Heart: regular rate and rhythm Abdomen: soft, non-tender; bowel sounds normal; no masses,  no organomegaly Extremities: extremities normal, atraumatic, no cyanosis or edema Skin: Skin color, texture, turgor normal. No rashes or lesions Lymph nodes: Cervical, supraclavicular, and axillary nodes normal. No abnormal inguinal nodes palpated Neurologic: Grossly normal     Pelvic: External genitalia:  no lesions              Urethra:  normal appearing urethra with no masses, tenderness or lesions              Bartholins and Skenes: normal                 Vagina: normal appearing vagina with normal color and discharge, no lesions.               Cervix: no lesions, no cervical motion tenderness               Bimanual Exam:  Uterus:  normal size, contour, position,  consistency, mobility, non-tender              Adnexa: no mass, fullness, tenderness          Chaperone was present for exam.   A:  Well Woman GYN exam                             P:        Pap smear collected             Encouraged annual mammogram screening         Counseled on all options to control the bleeding such as IUD, ablation, RLH. She will consider her options             Labs and immunizations with her primary             Discussed breast self exams             Encouraged safe sexual practices and enouraged healthy lifestyle practices with diet and exercise  Earley Favor

## 2023-05-18 LAB — ESTRADIOL: Estradiol: 71 pg/mL

## 2023-05-18 LAB — FOLLICLE STIMULATING HORMONE: FSH: 13.7 m[IU]/mL

## 2023-09-27 ENCOUNTER — Encounter: Payer: Self-pay | Admitting: Internal Medicine

## 2023-09-27 ENCOUNTER — Ambulatory Visit: Payer: MEDICAID | Admitting: Internal Medicine

## 2023-09-27 VITALS — BP 104/70 | HR 76 | Temp 97.8°F | Ht 67.5 in | Wt 224.0 lb

## 2023-09-27 DIAGNOSIS — R1084 Generalized abdominal pain: Secondary | ICD-10-CM

## 2023-09-27 MED ORDER — SEMAGLUTIDE (1 MG/DOSE) 4 MG/3ML ~~LOC~~ SOPN: 1.0000 mg | PEN_INJECTOR | SUBCUTANEOUS | 1 refills | Status: DC

## 2023-09-27 NOTE — Progress Notes (Signed)
Subjective:    Patient ID: Christine Koch, female    DOB: 1975/11/16, 48 y.o.   MRN: [id number]  HPI Here due to abdominal pain With mom  Just at Dr Rinaldo Cloud last week--A1c was 5.9% Still with bad constipation despite the linzess Told to try magnesium---but still doesn't go well Daily abdominal pain--that "she can't function"  Some nausea---but no vomiting "Sour burps"  Able to eat---but still careful  Current Outpatient Medications on File Prior to Visit  Medication Sig Dispense Refill   cabergoline (DOSTINEX) 0.5 MG tablet Take 0.25 mg by mouth 2 (two) times a week. Mondays and Thursdays     DULoxetine (CYMBALTA) 30 MG capsule Take 30 mg by mouth daily.     fluticasone (FLONASE) 50 MCG/ACT nasal spray Place into both nostrils daily as needed.      levothyroxine (SYNTHROID, LEVOTHROID) 175 MCG tablet Take 175 mcg by mouth daily.     linaclotide (LINZESS) 290 MCG CAPS capsule Take 290 mcg by mouth daily.     metFORMIN (GLUCOPHAGE) 500 MG tablet Take 1,000 mg by mouth 2 (two) times daily with a meal. One in the am and two in pm     MYRBETRIQ 50 MG TB24 tablet Take 50 mg by mouth daily.     naproxen (NAPROSYN) 500 MG tablet Take 500 mg by mouth daily as needed.     OZEMPIC, 2 MG/DOSE, 8 MG/3ML SOPN Inject into the skin.     rosuvastatin (CRESTOR) 10 MG tablet Take 10 mg by mouth 2 (two) times a week. Mondays and Thursdays     topiramate (TOPAMAX) 50 MG tablet Take 50 mg by mouth 2 (two) times daily.     Vitamin D, Ergocalciferol, (DRISDOL) 50000 units CAPS capsule Take 5,000 Units by mouth once a week.  6   No current facility-administered medications on file prior to visit.    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)    Depression    Dermatophytosis of nail    Diabetes (HCC)    H/O: pneumonia    Headache    Impacted cerumen     Internal hemorrhoids without mention of complication    Other acute otitis externa    Thyroid disease    Unspecified cerebral artery occlusion with cerebral infarction 1995    Past Surgical History:  Procedure Laterality Date   ADENOIDECTOMY  1979   MYRINGOTOMY  1979   TONSILLECTOMY  1993   TUMOR REMOVAL  1995   Pituitary tumor   VENTRICULO-PERITONEAL SHUNT PLACEMENT / LAPAROSCOPIC INSERTION PERITONEAL CATHETER  1995   for hydrocephaly    Family History  Problem Relation Age of Onset   Diabetes Mother    Hypertension Mother    Colon polyps Mother    Diabetes Maternal Aunt    Diabetes Maternal Grandmother    Hypertension Maternal Grandmother    Heart disease Maternal Grandmother    Cancer Maternal Grandfather        unknown type? possibly lung cancer   Diabetes Paternal Grandmother    Colon cancer Neg Hx    Stomach cancer Neg Hx     Social History   Socioeconomic History   Marital status: Single    Spouse name: Not on file   Number of children: Not on file   Years of education: Not on file  Highest education level: Not on file  Occupational History   Not on file  Tobacco Use   Smoking status: Never   Smokeless tobacco: Never  Vaping Use   Vaping status: Never Used  Substance and Sexual Activity   Alcohol use: Not Currently    Alcohol/week: 0.0 standard drinks of alcohol   Drug use: Not Currently   Sexual activity: Not Currently    Birth control/protection: Abstinence    Comment: intercourse age 89 , less than 5 sexual partners  Other Topics Concern   Not on file  Social History Narrative   Not on file   Social Drivers of Health   Financial Resource Strain: Not on file  Food Insecurity: Not on file  Transportation Needs: Not on file  Physical Activity: Not on file  Stress: Not on file  Social Connections: Not on file  Intimate Partner Violence: Not on file   Review of Systems Thinks she tolerated the lower doses of ozempic Mom held this week's  dose---last dose over a week ago No dysuria or other urinary symptoms     Objective:   Physical Exam Constitutional:      Appearance: She is well-developed.  Abdominal:     Palpations: Abdomen is soft.     Comments: No bowel sounds Soft with slight mild tenderness--nothing striking  Musculoskeletal:     Cervical back: Neck supple.  Lymphadenopathy:     Cervical: No cervical adenopathy.  Neurological:     Mental Status: She is alert.            Assessment & Plan:

## 2023-09-27 NOTE — Assessment & Plan Note (Addendum)
Seems to be clearly from obstipation from the ozempic Will hold for now Continue the linzess and use magnesium prn  When feels better, can resume ozempic--but will cut to 1mg  weekly dose

## 2023-11-22 IMAGING — MG MM DIGITAL SCREENING BILAT W/ TOMO AND CAD
6 of 10 series · 6 of 30 positions shown · non-contrast
Comparison: Previous exam(s).

ACR Breast Density Category a: The breast tissue is almost entirely
fatty.

CLINICAL DATA: Screening.

EXAM:
DIGITAL SCREENING BILATERAL MAMMOGRAM WITH TOMOSYNTHESIS AND CAD
TECHNIQUE: Bilateral screening digital craniocaudal and mediolateral oblique
mammograms were obtained. Bilateral screening digital breast
tomosynthesis was performed. The images were evaluated with
computer-aided detection.

[R MLO synth-2D (1 of 2)]
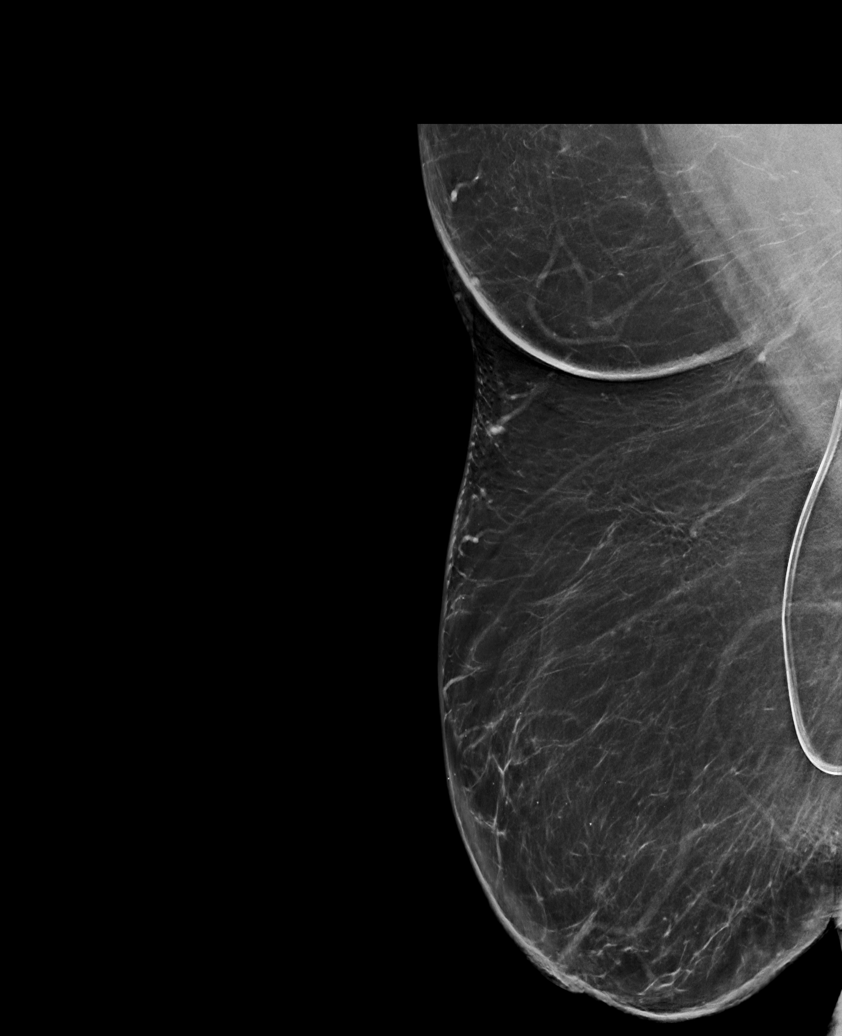

[R CC synth-2D]
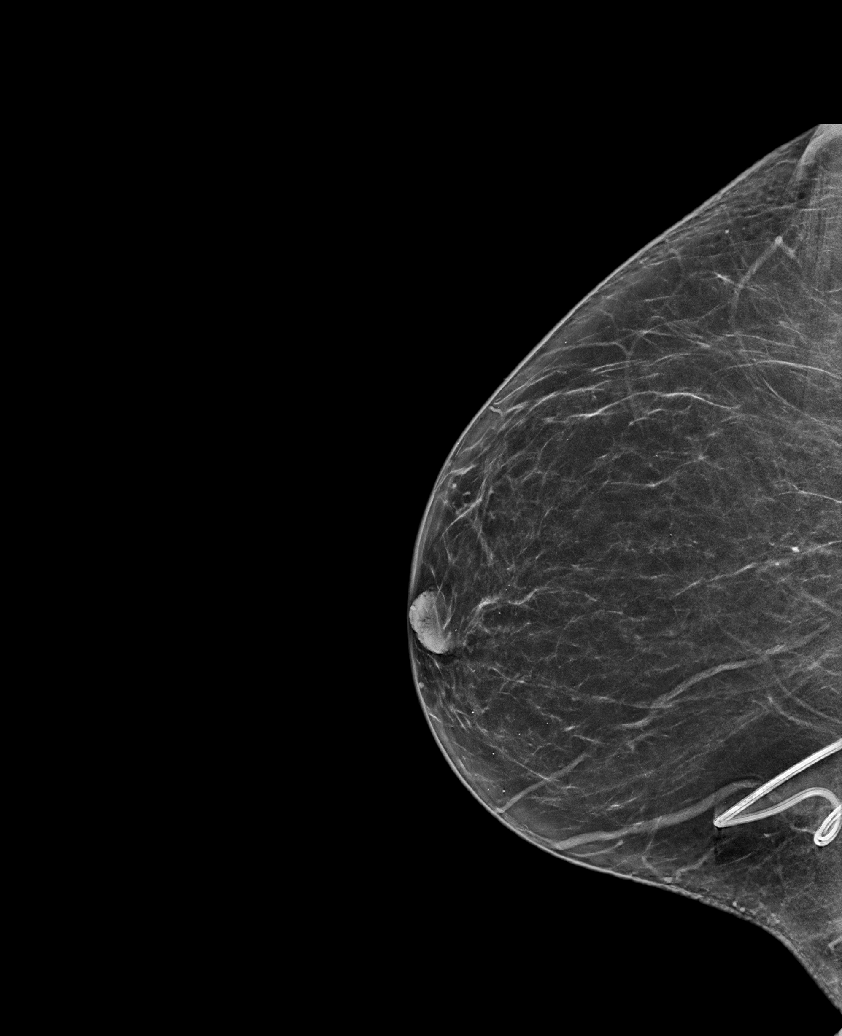

[R MLO synth-2D (2 of 2)]
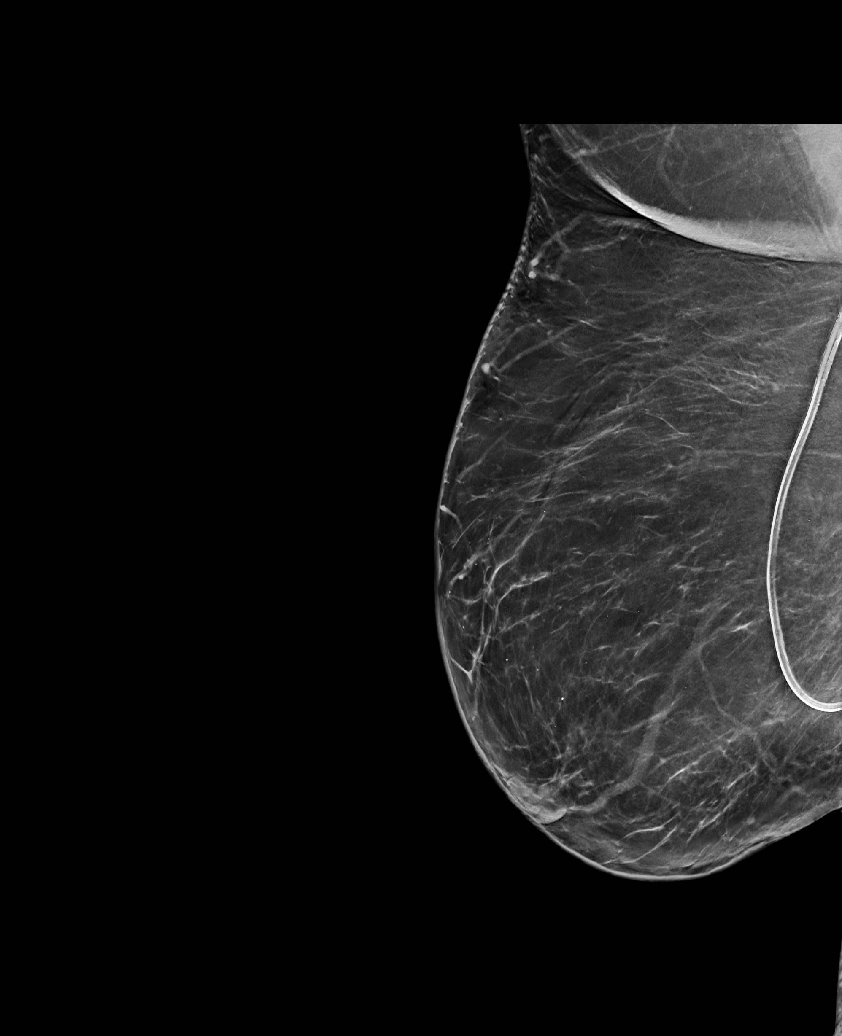

[L MLO synth-2D]
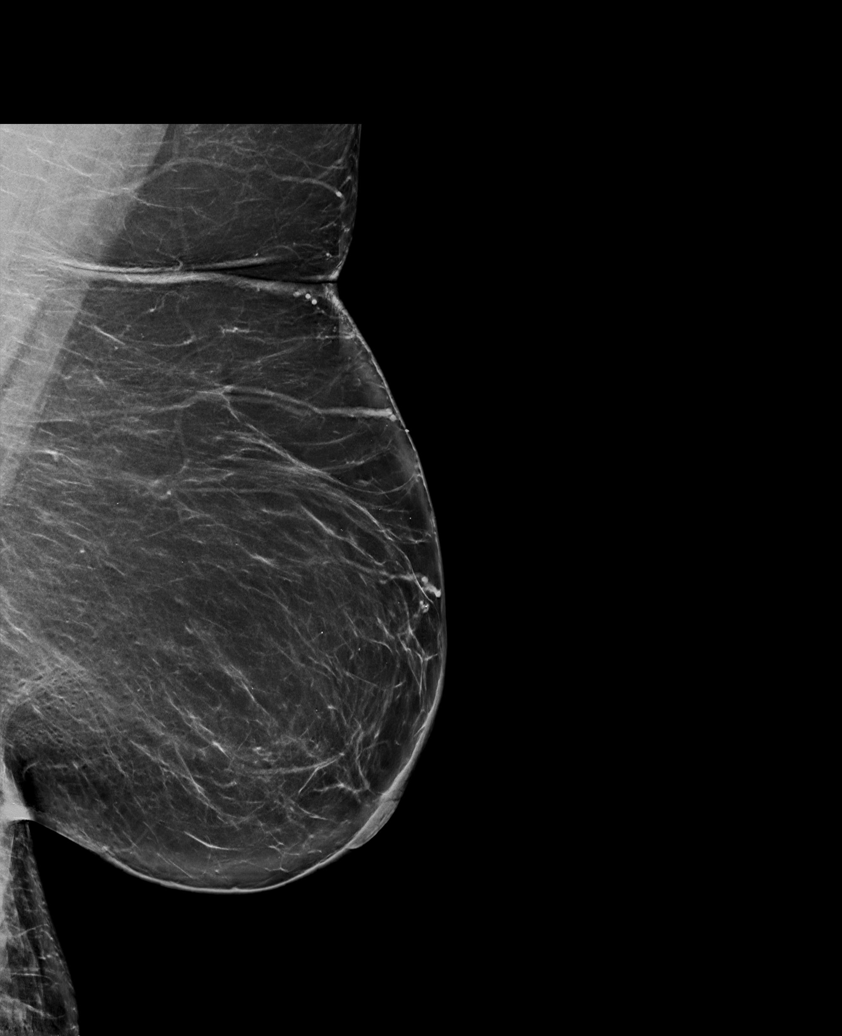

[L CC synth-2D]
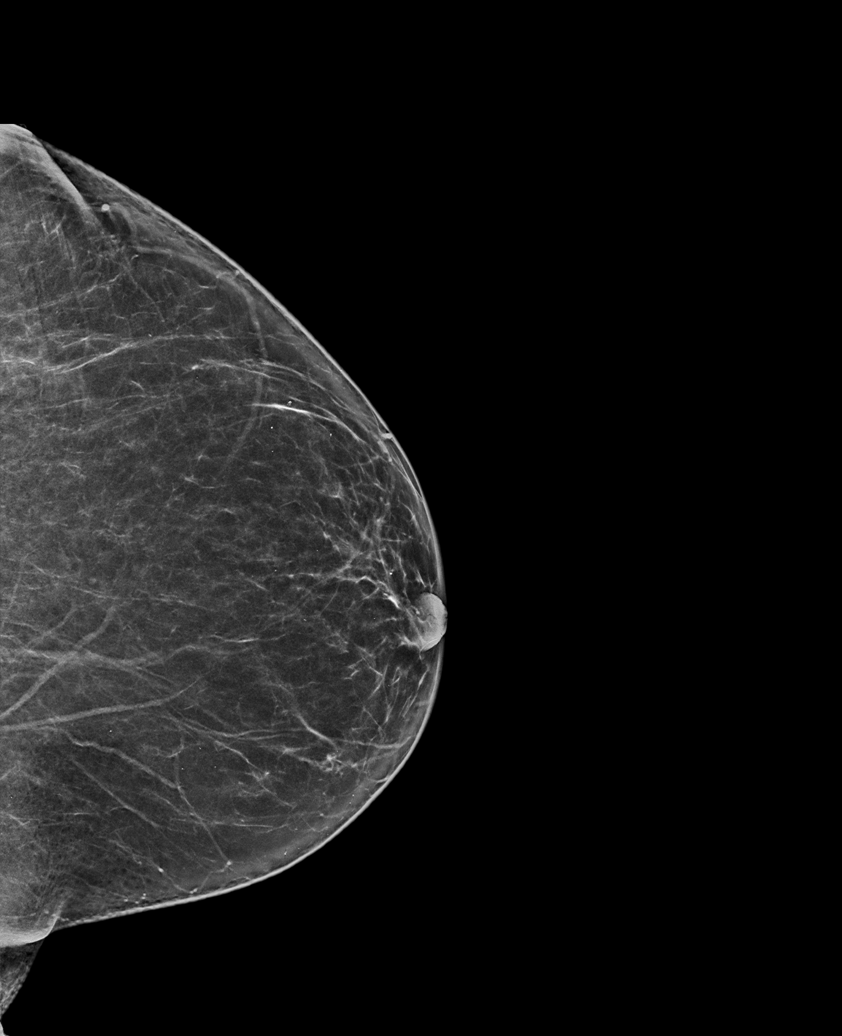

[R MLO tomo · tomo slice 43/85.0]
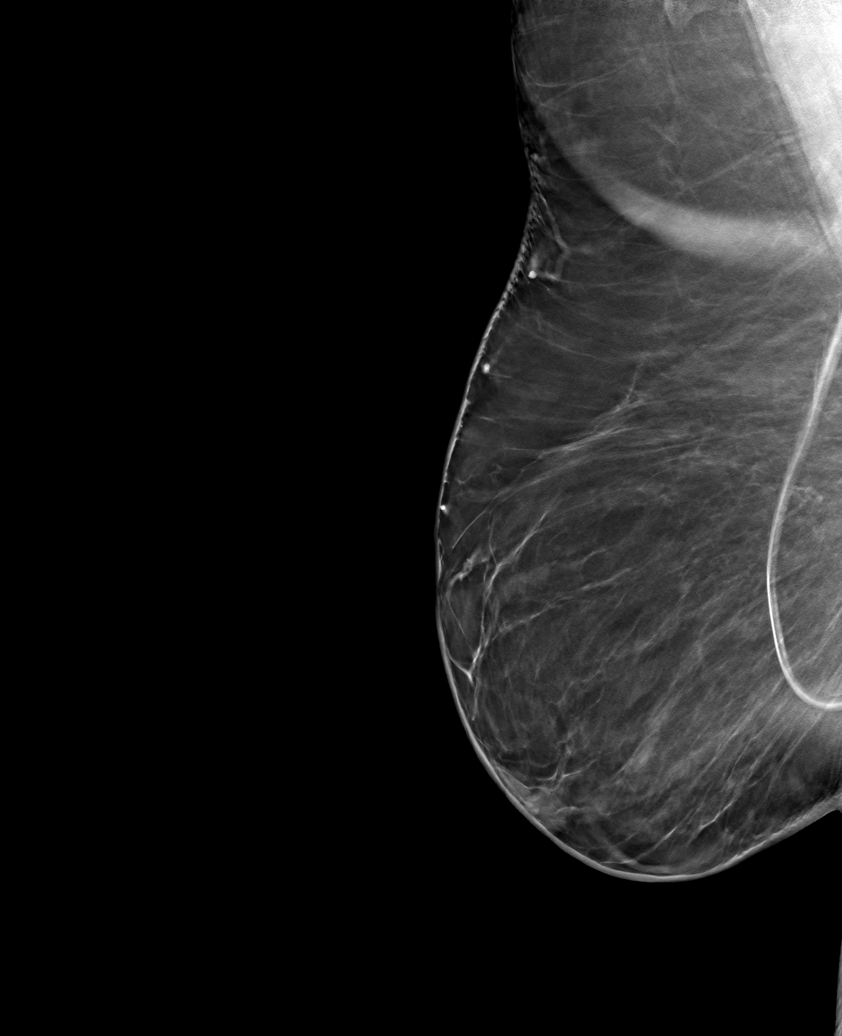

[6 of 30 positions shown; findings below may reference images not displayed]

FINDINGS: There are no findings suspicious for malignancy.
IMPRESSION: No mammographic evidence of malignancy. A result letter of this
screening mammogram will be mailed directly to the patient.

RECOMMENDATION:
Screening mammogram in one year. (Code:0E-3-N98)

BI-RADS CATEGORY  1: Negative.

## 2023-11-27 ENCOUNTER — Other Ambulatory Visit: Payer: Self-pay | Admitting: Obstetrics and Gynecology

## 2023-11-27 DIAGNOSIS — Z1231 Encounter for screening mammogram for malignant neoplasm of breast: Secondary | ICD-10-CM

## 2023-12-08 ENCOUNTER — Other Ambulatory Visit: Payer: Self-pay | Admitting: Internal Medicine

## 2023-12-28 ENCOUNTER — Ambulatory Visit
Admission: RE | Admit: 2023-12-28 | Discharge: 2023-12-28 | Disposition: A | Discharge status: Home / Self Care | Payer: MEDICAID | Source: Ambulatory Visit | Attending: Obstetrics and Gynecology | Admitting: Obstetrics and Gynecology

## 2023-12-28 DIAGNOSIS — Z1231 Encounter for screening mammogram for malignant neoplasm of breast: Secondary | ICD-10-CM

## 2024-01-01 ENCOUNTER — Encounter: Payer: Self-pay | Admitting: Obstetrics and Gynecology

## 2024-01-28 LAB — HM DIABETES EYE EXAM

## 2024-05-19 ENCOUNTER — Ambulatory Visit (INDEPENDENT_AMBULATORY_CARE_PROVIDER_SITE_OTHER): Payer: MEDICAID | Admitting: Obstetrics and Gynecology

## 2024-05-19 VITALS — BP 100/50 | HR 75 | Ht 67.32 in | Wt 229.2 lb

## 2024-05-19 DIAGNOSIS — Z01419 Encounter for gynecological examination (general) (routine) without abnormal findings: Secondary | ICD-10-CM | POA: Diagnosis not present

## 2024-05-19 DIAGNOSIS — E2839 Other primary ovarian failure: Secondary | ICD-10-CM | POA: Diagnosis not present

## 2024-05-19 DIAGNOSIS — Z1331 Encounter for screening for depression: Secondary | ICD-10-CM | POA: Diagnosis not present

## 2024-05-19 NOTE — Progress Notes (Signed)
 48 y.o. y.o. female here for annual exam.   female here for annual exam. She is frustrated with her periods and reports they last for 7 days a month. She has suffered an ischemic stroke during surgery when her carotid vessel was nicked in surgery.  She is not sexually active and does not want to be pregnant.  Her mother is her care taker and works in [facility] [employer] [facility] She is nervous about side effects of hormones and does not want to take any Patient's last menstrual period was 05/12/2024 (exact date). Period Cycle (Days): 28 Period Duration (Days): 5-7 Period Pattern: Regular Menstrual Flow: Moderate Menstrual Control:  (depends) Dysmenorrhea: (!) Mild Dysmenorrhea Symptoms: Cramping, Headache, Nausea  Periods do not bother her and are not heavy or painful  Last mammogram: 01/01/24 birads 1 neg Last colonoscopy: 03/12/17 repeat at age 51 There is no height or weight on file to calculate BMI.     05/19/2024    2:41 PM  Depression screen PHQ 2/9  Decreased Interest 0  Down, Depressed, Hopeless 0  PHQ - 2 Score 0    Last menstrual period 05/12/2024.     Component Value Date/Time   DIAGPAP  03/20/2022 1507    - Negative for intraepithelial lesion or malignancy (NILM)   ADEQPAP  03/20/2022 1507    Satisfactory for evaluation; transformation zone component PRESENT.    GYN HISTORY:    Component Value Date/Time   DIAGPAP  03/20/2022 1507    - Negative for intraepithelial lesion or malignancy (NILM)   ADEQPAP  03/20/2022 1507    Satisfactory for evaluation; transformation zone component PRESENT.    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)    Depression    Dermatophytosis of nail    Diabetes (HCC)    H/O: pneumonia    Headache    Impacted cerumen    Internal hemorrhoids without  mention of complication    Other acute otitis externa    Thyroid disease    Unspecified cerebral artery occlusion with cerebral infarction 1995    Past Surgical History:  Procedure Laterality Date   ADENOIDECTOMY  1979   MYRINGOTOMY  1979   TONSILLECTOMY  1993   TUMOR REMOVAL  1995   Pituitary tumor   VENTRICULO-PERITONEAL SHUNT PLACEMENT / LAPAROSCOPIC INSERTION PERITONEAL CATHETER  1995   for hydrocephaly    Current Outpatient Medications on File Prior to Visit  Medication Sig Dispense Refill   cabergoline (DOSTINEX) 0.5 MG tablet Take 0.25 mg by mouth 2 (two) times a week. Mondays and Thursdays     DULoxetine (CYMBALTA) 30 MG capsule Take 30 mg by mouth daily.     fluticasone (FLONASE) 50 MCG/ACT nasal spray Place into both nostrils daily as needed.      levothyroxine (SYNTHROID, LEVOTHROID) 175 MCG tablet Take 175 mcg by mouth daily.     linaclotide (LINZESS) 290 MCG CAPS capsule Take 290 mcg by mouth daily.     metFORMIN (GLUCOPHAGE) 500 MG tablet Take 1,000 mg by mouth 2 (two) times daily with a meal. One in the am and two in pm     MYRBETRIQ 50 MG TB24 tablet Take 50 mg by mouth daily.     naproxen  (NAPROSYN ) 500 MG tablet  Take 500 mg by mouth daily as needed.     ondansetron (ZOFRAN) 4 MG tablet 1 tablet Orally three times a day prn; Duration: 30 days     rosuvastatin (CRESTOR) 10 MG tablet Take 10 mg by mouth 2 (two) times a week. Mondays and Thursdays     Semaglutide , 1 MG/DOSE, 4 MG/3ML SOPN Inject 1 mg as directed once a week. 3 mL 1   topiramate (TOPAMAX) 50 MG tablet Take 50 mg by mouth 2 (two) times daily.     Vitamin D, Ergocalciferol, (DRISDOL) 50000 units CAPS capsule Take 5,000 Units by mouth once a week.  6   No current facility-administered medications on file prior to visit.    Social History   Socioeconomic History   Marital status: Single    Spouse name: Not on file   Number of children: Not on file   Years of education: Not on file   Highest  education level: Not on file  Occupational History   Not on file  Tobacco Use   Smoking status: Never   Smokeless tobacco: Never  Vaping Use   Vaping status: Never Used  Substance and Sexual Activity   Alcohol use: Not Currently    Alcohol/week: 0.0 standard drinks of alcohol   Drug use: Not Currently   Sexual activity: Not Currently    Birth control/protection: Abstinence    Comment: intercourse age 37 , less than 5 sexual partners  Other Topics Concern   Not on file  Social History Narrative   Not on file   Social Drivers of Health   Financial Resource Strain: Not on file  Food Insecurity: Not on file  Transportation Needs: Not on file  Physical Activity: Not on file  Stress: Not on file  Social Connections: Not on file  Intimate Partner Violence: Not on file    Family History  Problem Relation Age of Onset   Diabetes Mother    Hypertension Mother    Colon polyps Mother    Diabetes Maternal Aunt    Diabetes Maternal Grandmother    Hypertension Maternal Grandmother    Heart disease Maternal Grandmother    Cancer Maternal Grandfather        unknown type? possibly lung cancer   Diabetes Paternal Grandmother    Colon cancer Neg Hx    Stomach cancer Neg Hx    Breast cancer Neg Hx      Allergies  Allergen Reactions   Codeine     REACTION: Vomiting      Patient's last menstrual period was Patient's last menstrual period was 05/12/2024 (exact date)..            Review of Systems Alls systems reviewed and are negative.     Physical Exam Constitutional:      Appearance: Normal appearance.  Genitourinary:     Vulva and urethral meatus normal.     No lesions in the vagina.     Right Labia: No rash, lesions or skin changes.    Left Labia: No lesions, skin changes or rash.    No vaginal discharge or tenderness.     No vaginal prolapse present.    No vaginal atrophy present.     Right Adnexa: not tender, not palpable and no mass present.    Left Adnexa:  not tender, not palpable and no mass present.    No cervical motion tenderness or discharge.     Uterus is not enlarged, tender or irregular.  Breasts:  Right: Normal.     Left: Normal.  HENT:     Head: Normocephalic.  Neck:     Thyroid: No thyroid mass, thyromegaly or thyroid tenderness.  Cardiovascular:     Rate and Rhythm: Normal rate and regular rhythm.     Heart sounds: Normal heart sounds, S1 normal and S2 normal.  Pulmonary:     Effort: Pulmonary effort is normal.     Breath sounds: Normal breath sounds and air entry.  Abdominal:     General: There is no distension.     Palpations: Abdomen is soft. There is no mass.     Tenderness: There is no abdominal tenderness. There is no guarding or rebound.  Musculoskeletal:        General: Normal range of motion.     Cervical back: Full passive range of motion without pain, normal range of motion and neck supple. No tenderness.     Right lower leg: No edema.     Left lower leg: No edema.  Neurological:     Mental Status: She is alert.  Skin:    General: Skin is warm.  Psychiatric:        Mood and Affect: Mood normal.        Behavior: Behavior normal.        Thought Content: Thought content normal.  Vitals and nursing note reviewed. Exam conducted with a chaperone present.       A:         Well Woman GYN exam                             P:        Pap smear not indicated Encouraged annual mammogram screening Colon cancer screening up-to-date DXA ordered today Labs and immunizations to do with PMD Discussed breast self exams Encouraged healthy lifestyle practices   No follow-ups on file.  Almarie MARLA Carpen

## 2024-06-16 ENCOUNTER — Ambulatory Visit (INDEPENDENT_AMBULATORY_CARE_PROVIDER_SITE_OTHER): Payer: MEDICAID | Admitting: Podiatry

## 2024-06-16 ENCOUNTER — Ambulatory Visit (INDEPENDENT_AMBULATORY_CARE_PROVIDER_SITE_OTHER): Payer: MEDICAID

## 2024-06-16 DIAGNOSIS — M722 Plantar fascial fibromatosis: Secondary | ICD-10-CM

## 2024-06-16 MED ORDER — MELOXICAM 15 MG PO TABS: 15.0000 mg | ORAL_TABLET | Freq: Every day | ORAL | 3 refills | Status: DC

## 2024-06-16 NOTE — Patient Instructions (Signed)
 Exercises for Plantar Fasciitis Foot and leg exercises can help if you have plantar fasciitis. Only do the exercises you were told to do. Make sure you know how to do the exercises safely. Follow the steps below. It's normal to feel mild discomfort. Stop if you feel pain or your pain gets worse. Do not start these exercises until told by your health care provider. Stretching and range-of-motion exercises These exercises warm up your muscles and joints. They also help with movement and flexibility of your foot. They can help with pain. Plantar fascia stretch This exercise will stretch your plantar fascia, which is a band of thick tissue on the bottom of your foot. Sit with your left / right leg crossed over your other knee. Hold your heel with one hand with that thumb near your arch. With your other hand, hold your toes. Gently pull your toes back toward the top of your foot. You should feel a stretch on the bottom of your toes, on the bottom of your foot, or both. Hold this stretch for __________ seconds. Slowly let go of your toes. Go back to the starting position. Repeat __________ times. Do this exercise __________ times a day. Gastroc stretch, standing This exercise is called an upper calf, or gastroc, stretch. It stretches the muscles in the back of your upper calf. Stand with your hands against a wall. Extend your left / right leg behind you. Bend your front knee just a little. Keep your heels on the floor, your toes facing forward, and your back knee straight. Shift your weight toward the wall. Do not arch your back. You should feel a gentle stretch in your upper calf. Hold this position for __________ seconds. Repeat __________ times. Do this exercise __________ times a day. Soleus stretch, standing This exercise is called a lower calf, or soleus, stretch. It stretches the muscles in the back of your lower calf. Stand with your hands against a wall. Extend your left / right leg behind  you, and bend your front knee slightly. Keep your heels on the floor and your toes facing forward. Bend your back knee and shift your weight slightly over your back leg. You should feel a gentle stretch deep in your lower calf. Hold this position for __________ seconds. Repeat __________ times. Do this exercise __________ times a day. Gastroc and soleus stretch, standing step This exercise stretches the muscles in the back of your lower leg. This includes your gastroc and soleus muscles. Stand with the ball of your left / right foot on the front of a step. The ball of your foot is on the walking surface, right under your toes. Keep your other foot firmly on the same step. Hold on to the wall or a railing for balance. Slowly lift your other foot, letting your body weight press your heel down over the edge of the front of the step. Keep your knee straight and unbent. You should feel a stretch in your calf. Hold this position for __________ seconds. Return both feet to the step. Repeat this exercise with a slight bend in your left / right knee. Repeat __________ times with your left / right knee straight and __________ times with your left / right knee bent. Do this exercise __________ times a day. Balance exercise This exercise builds your balance and strength control of your arch. It helps take pressure off your plantar fascia. Single leg stand If this exercise is too easy, you can try it with your eyes closed  or while standing on a pillow. Without shoes, stand near a railing or in a doorway. You may hold on to the railing or doorway as needed. Stand on your left / right foot. Keep your big toe down on the floor. Lift the arch of your foot. You should feel a stretch across the bottom of your foot and arch. Do not let your foot roll inward. Hold this position for __________ seconds. Repeat __________ times. Do this exercise __________ times a day. This information is not intended to replace  advice given to you by your health care provider. Make sure you discuss any questions you have with your health care provider. Document Revised: 01/15/2023 Document Reviewed: 01/15/2023 Elsevier Patient Education  2024 [employer].

## 2024-06-16 NOTE — Progress Notes (Signed)
 Subjective:  Patient ID: Christine Koch, female    DOB: [birth year],  MRN: [id number] HPI Chief Complaint  Patient presents with   Foot Pain    NP- Bilateral foot pain    48 y.o. female presents with the above complaint.   ROS: Denies fever chills nausea muscle aches pains calf pain back pain chest pain shortness of breath.  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)    Depression    Dermatophytosis of nail    Diabetes (HCC)    H/O: pneumonia    Headache    Impacted cerumen    Internal hemorrhoids without mention of complication    Other acute otitis externa    Thyroid disease    Unspecified cerebral artery occlusion with cerebral infarction 1995   Past Surgical History:  Procedure Laterality Date   ADENOIDECTOMY  1979   MYRINGOTOMY  1979   TONSILLECTOMY  1993   TUMOR REMOVAL  1995   Pituitary tumor   VENTRICULO-PERITONEAL SHUNT PLACEMENT / LAPAROSCOPIC INSERTION PERITONEAL CATHETER  1995   for hydrocephaly    Current Outpatient Medications:    meloxicam  (MOBIC ) 15 MG tablet, Take 1 tablet (15 mg total) by mouth daily., Disp: 30 tablet, Rfl: 3   cabergoline (DOSTINEX) 0.5 MG tablet, Take 0.25 mg by mouth 2 (two) times a week. Mondays and Thursdays, Disp: , Rfl:    Dulaglutide (TRULICITY) 0.75 MG/0.5ML SOAJ, inject 0.75mg  Subcutaneous weekly; Duration: 28 days, Disp: , Rfl:    DULoxetine (CYMBALTA) 30 MG capsule, Take 30 mg by mouth daily., Disp: , Rfl:    fluticasone (FLONASE) 50 MCG/ACT nasal spray, Place into both nostrils daily as needed. , Disp: , Rfl:    levothyroxine (SYNTHROID, LEVOTHROID) 175 MCG tablet, Take 175 mcg by mouth daily., Disp: , Rfl:    linaclotide (LINZESS) 290 MCG CAPS capsule, Take 290 mcg by mouth daily., Disp: , Rfl:    metFORMIN (GLUCOPHAGE) 500 MG tablet, Take 1,000 mg by mouth 2 (two) times daily with a meal. One in the am and two in pm, Disp: , Rfl:    MYRBETRIQ 50  MG TB24 tablet, Take 50 mg by mouth daily., Disp: , Rfl:    naproxen  (NAPROSYN ) 500 MG tablet, Take 500 mg by mouth daily as needed., Disp: , Rfl:    ondansetron (ZOFRAN) 4 MG tablet, 1 tablet Orally three times a day prn; Duration: 30 days, Disp: , Rfl:    rosuvastatin (CRESTOR) 10 MG tablet, Take 10 mg by mouth 2 (two) times a week. Mondays and Thursdays, Disp: , Rfl:    Semaglutide , 1 MG/DOSE, 4 MG/3ML SOPN, Inject 1 mg as directed once a week., Disp: 3 mL, Rfl: 1   topiramate (TOPAMAX) 50 MG tablet, Take 50 mg by mouth 2 (two) times daily., Disp: , Rfl:    Vitamin D, Ergocalciferol, (DRISDOL) 50000 units CAPS capsule, Take 5,000 Units by mouth once a week., Disp: , Rfl: 6  Allergies  Allergen Reactions   Codeine     REACTION: Vomiting   Erythromycin Other (See Comments)   Tirzepatide  Nausea And Vomiting   Review of Systems Objective:  There were no vitals filed for this visit.  General: Well developed, nourished, in no acute distress, alert and oriented x3   Dermatological: Skin is warm, dry and supple bilateral. Nails x 10 are well maintained; remaining integument appears unremarkable at this time. There are no open sores, no preulcerative lesions, no  rash or signs of infection present.  Vascular: Dorsalis Pedis artery and Posterior Tibial artery pedal pulses are 2/4 bilateral with immedate capillary fill time. Pedal hair growth present. No varicosities and no lower extremity edema present bilateral.   Neruologic: Grossly intact via light touch bilateral. Vibratory intact via tuning fork bilateral. Protective threshold with Semmes Wienstein monofilament intact to all pedal sites bilateral. Patellar and Achilles deep tendon reflexes 2+ bilateral. No Babinski or clonus noted bilateral.   Musculoskeletal: No gross boney pedal deformities bilateral. No pain, crepitus, or limitation noted with foot and ankle range of motion bilateral. Muscular strength 5/5 in all groups tested bilateral.   Pain on palpation medial calcaneal tubercles bilateral.  Gait: Unassisted, Nonantalgic.    Radiographs:  Radiographs taken today demonstrate osseously mature individual soft tissue increase in density plantar fascial canny insertion site no significant osseous abnormalities otherwise good bone mineralization.  Assessment & Plan:   Assessment: Plantar fasciitis bilateral.  History of stroke left hemisphere  Plan: Injected bilateral heels today 20 mg Kenalog 5 mg Marcaine point maximal tenderness.  Tolerated procedure well.  Lestrin plantar fascia braces bilateral started her on meloxicam  15 mg 1 p.o. daily schedule with Trish for orthotics.     Vernestine Brodhead T. [place], [place]

## 2024-07-11 ENCOUNTER — Ambulatory Visit (INDEPENDENT_AMBULATORY_CARE_PROVIDER_SITE_OTHER): Payer: MEDICAID

## 2024-07-11 DIAGNOSIS — M722 Plantar fascial fibromatosis: Secondary | ICD-10-CM | POA: Diagnosis not present

## 2024-07-11 DIAGNOSIS — M2142 Flat foot [pes planus] (acquired), left foot: Secondary | ICD-10-CM | POA: Diagnosis not present

## 2024-07-11 DIAGNOSIS — M2141 Flat foot [pes planus] (acquired), right foot: Secondary | ICD-10-CM

## 2024-07-11 NOTE — Progress Notes (Signed)
 Orthotics   Patient was present and evaluated for Custom molded foot orthotics. Patient will benefit from CFO's to provide total contact to BIL MLA's helping to balance and distribute body weight more evenly across BIL feet helping to reduce plantar pressure and pain. Orthotic will also encourage FF / RF alignment  Patient was scanned today and will return for fitting upon receipt

## 2024-08-25 ENCOUNTER — Ambulatory Visit: Payer: MEDICAID | Admitting: Podiatry

## 2024-08-25 DIAGNOSIS — M722 Plantar fascial fibromatosis: Secondary | ICD-10-CM

## 2024-08-25 NOTE — Progress Notes (Signed)
 She presented with her mother today to pick up her orthotics.  She states that her plantar fasciitis is 100% resolved at this point.  Her orthotics were not in but she does not experience pain.  I will notify her mother personally when her orthotics are in.

## 2024-09-05 ENCOUNTER — Encounter: Payer: Self-pay | Admitting: Family Medicine

## 2024-09-05 ENCOUNTER — Ambulatory Visit (INDEPENDENT_AMBULATORY_CARE_PROVIDER_SITE_OTHER): Payer: MEDICAID | Admitting: Family Medicine

## 2024-09-05 VITALS — BP 112/64 | HR 73 | Temp 98.0°F | Ht 67.5 in | Wt 229.1 lb

## 2024-09-05 DIAGNOSIS — M7918 Myalgia, other site: Secondary | ICD-10-CM | POA: Diagnosis not present

## 2024-09-05 DIAGNOSIS — K219 Gastro-esophageal reflux disease without esophagitis: Secondary | ICD-10-CM | POA: Diagnosis not present

## 2024-09-05 NOTE — Assessment & Plan Note (Addendum)
 Acute, most consistent with but slightly atypical for shoulder deltoid bursitis. No sign of cellulitis or mass.  Good range of motion of shoulder.  Negative Spurling and no neck pain so doubt cervical radiculopathy. Given ongoing stomach irritation will avoid NSAIDs.  Patient with diabetes so we will avoid prednisone . Start with home physical therapy for shoulder bursitis, ice and can use topical diclofenac  gel up to 4 times a day on the lateral arm.  Return ER precautions provided.

## 2024-09-05 NOTE — Assessment & Plan Note (Signed)
 Acute, majority of patient's symptoms including nausea, constipation as well as the new reflux are likely secondary to Ozempic . I suggested a trial of Prilosec 20 to 40 mg daily as well as avoidance of acidic foods to try to improve this symptom. Discussed with endocrinologist  discontinuation or decrease of Ozempic  dose.

## 2024-09-05 NOTE — Progress Notes (Signed)
 "   Patient ID: Christine Koch, female    DOB: 07-02-1976, 49 y.o.   MRN: [id number]  This visit was conducted in person.  BP 112/64   Pulse 73   Temp 98 F (36.7 C) (Oral)   Ht 5' 7.5 (1.715 m)   Wt 229 lb 2 oz (103.9 kg)   LMP 08/29/2024   SpO2 99%   BMI 35.36 kg/m    CC:  Chief Complaint  Patient presents with   Arm Pain    Left Deltoid   Gastroesophageal Reflux    Sour Stomach     Subjective:   HPI: Christine Koch is a 49 y.o. female presenting on 09/05/2024 for Arm Pain (Left Deltoid) and Gastroesophageal Reflux (Sour Stomach/)  PCP : Dr. Nichole   New onset right arm pain in last 4 weeks.  Constant pain , tender to touch.  No worse with movement.  No skin change.  No neck pain.  NO fever   She swims on Fridays.  She has to lean over to color ar draw.   She has  noted upset stomach in last few week, nausea after   She is on ozempic  in last year.SABRA 0.5 ,mg weekly.  Uses zofran prn.   Burping, and  reflux, frequency constipation.SABRA on linzess daily.  No severe epigastric pain.  Has use magnesium.   Uses naproxen  once a week.        Relevant past medical, surgical, family and social history reviewed and updated as indicated. Interim medical history since our last visit reviewed. Allergies and medications reviewed and updated. Outpatient Medications Prior to Visit  Medication Sig Dispense Refill   cabergoline (DOSTINEX) 0.5 MG tablet Take 0.25 mg by mouth 2 (two) times a week. Mondays and Thursdays     DULoxetine (CYMBALTA) 30 MG capsule Take 30 mg by mouth daily.     fluticasone (FLONASE) 50 MCG/ACT nasal spray Place into both nostrils daily as needed.      levothyroxine (SYNTHROID, LEVOTHROID) 175 MCG tablet Take 175 mcg by mouth daily.     linaclotide (LINZESS) 290 MCG CAPS capsule Take 290 mcg by mouth daily.     metFORMIN (GLUCOPHAGE) 500 MG tablet Take 1,000 mg by mouth 2 (two) times daily with a meal. One in the am and two in pm     MYRBETRIQ 50  MG TB24 tablet Take 50 mg by mouth daily.     naproxen  (NAPROSYN ) 500 MG tablet Take 500 mg by mouth daily as needed.     ondansetron (ZOFRAN) 4 MG tablet 1 tablet Orally three times a day prn; Duration: 30 days     rosuvastatin (CRESTOR) 10 MG tablet Take 10 mg by mouth 2 (two) times a week. Mondays and Thursdays     Semaglutide ,0.25 or 0.5MG /DOS, 2 MG/1.5ML SOPN Inject 0.5 mg into the skin once a week.     topiramate (TOPAMAX) 50 MG tablet Take 50 mg by mouth 2 (two) times daily.     Vitamin D, Ergocalciferol, (DRISDOL) 50000 units CAPS capsule Take 5,000 Units by mouth once a week.  6   Dulaglutide (TRULICITY) 0.75 MG/0.5ML SOAJ inject 0.75mg  Subcutaneous weekly; Duration: 28 days     meloxicam  (MOBIC ) 15 MG tablet Take 1 tablet (15 mg total) by mouth daily. 30 tablet 3   Semaglutide , 1 MG/DOSE, 4 MG/3ML SOPN Inject 1 mg as directed once a week. 3 mL 1   No facility-administered medications prior to visit.     Per  HPI unless specifically indicated in ROS section below Review of Systems  Constitutional:  Negative for fatigue and fever.  HENT:  Negative for congestion.   Eyes:  Negative for pain.  Respiratory:  Negative for cough and shortness of breath.   Cardiovascular:  Negative for chest pain, palpitations and leg swelling.  Gastrointestinal:  Positive for abdominal pain, constipation and nausea. Negative for vomiting.  Genitourinary:  Negative for dysuria and vaginal bleeding.  Musculoskeletal:  Negative for back pain.  Neurological:  Negative for syncope, light-headedness and headaches.  Psychiatric/Behavioral:  Negative for dysphoric mood.    Objective:  BP 112/64   Pulse 73   Temp 98 F (36.7 C) (Oral)   Ht 5' 7.5 (1.715 m)   Wt 229 lb 2 oz (103.9 kg)   LMP 08/29/2024   SpO2 99%   BMI 35.36 kg/m   Wt Readings from Last 3 Encounters:  09/05/24 229 lb 2 oz (103.9 kg)  05/19/24 229 lb 3.2 oz (104 kg)  09/27/23 224 lb (101.6 kg)      Physical Exam Constitutional:       General: She is not in acute distress.    Appearance: Normal appearance. She is well-developed. She is not ill-appearing or toxic-appearing.  HENT:     Head: Normocephalic.     Right Ear: Hearing, tympanic membrane, ear canal and external ear normal. Tympanic membrane is not erythematous, retracted or bulging.     Left Ear: Hearing, tympanic membrane, ear canal and external ear normal. Tympanic membrane is not erythematous, retracted or bulging.     Nose: No mucosal edema or rhinorrhea.     Right Sinus: No maxillary sinus tenderness or frontal sinus tenderness.     Left Sinus: No maxillary sinus tenderness or frontal sinus tenderness.     Mouth/Throat:     Pharynx: Uvula midline.  Eyes:     General: Lids are normal. Lids are everted, no foreign bodies appreciated.     Conjunctiva/sclera: Conjunctivae normal.     Pupils: Pupils are equal, round, and reactive to light.  Neck:     Thyroid: No thyroid mass or thyromegaly.     Vascular: No carotid bruit.     Trachea: Trachea normal.  Cardiovascular:     Rate and Rhythm: Normal rate and regular rhythm.     Pulses: Normal pulses.     Heart sounds: Normal heart sounds, S1 normal and S2 normal. No murmur heard.    No friction rub. No gallop.  Pulmonary:     Effort: Pulmonary effort is normal. No tachypnea or respiratory distress.     Breath sounds: Normal breath sounds. No decreased breath sounds, wheezing, rhonchi or rales.  Abdominal:     General: Bowel sounds are normal.     Palpations: Abdomen is soft.     Tenderness: There is no abdominal tenderness. There is no right CVA tenderness, left CVA tenderness, guarding or rebound. Negative signs include Murphy's sign.  Musculoskeletal:     Right shoulder: Tenderness present. No swelling, deformity, effusion or laceration. Normal range of motion.     Cervical back: Normal range of motion and neck supple.     Comments: No pain with movement of arm but tender to palpation over lateral  deltoid  Skin:    General: Skin is warm and dry.     Findings: No rash.  Neurological:     Mental Status: She is alert.  Psychiatric:        Mood and Affect: Mood  is not anxious or depressed.        Speech: Speech normal.        Behavior: Behavior normal. Behavior is cooperative.        Thought Content: Thought content normal.        Judgment: Judgment normal.       Results for orders placed or performed in visit on 01/30/24  [facility] DIABETES EYE EXAM   Collection Time: 01/28/24  1:57 PM  Result Value Ref Range   [facility] Diabetic Eye Exam No Retinopathy No Retinopathy    Assessment and Plan  Pain of right deltoid Assessment & Plan: Acute, most consistent with but slightly atypical for shoulder deltoid bursitis. No sign of cellulitis or mass.  Good range of motion of shoulder.  Negative Spurling and no neck pain so doubt cervical radiculopathy. Given ongoing stomach irritation will avoid NSAIDs.  Patient with diabetes so we will avoid prednisone . Start with home physical therapy for shoulder bursitis, ice and can use topical diclofenac  gel up to 4 times a day on the lateral arm.  Return ER precautions provided.   Gastroesophageal reflux disease, unspecified whether esophagitis present Assessment & Plan: Acute, majority of patient's symptoms including nausea, constipation as well as the new reflux are likely secondary to Ozempic . I suggested a trial of Prilosec 20 to 40 mg daily as well as avoidance of acidic foods to try to improve this symptom. Discussed with endocrinologist  discontinuation or decrease of Ozempic  dose.     No follow-ups on file.   Greig Ring, MD  "

## 2024-09-12 ENCOUNTER — Telehealth: Payer: Self-pay | Admitting: Podiatry

## 2024-09-12 NOTE — Telephone Encounter (Signed)
 Orthotics are in [facility] called patient LM on VM for pt to call and schedule an appt for PUO

## 2024-10-02 ENCOUNTER — Ambulatory Visit: Payer: Self-pay | Admitting: Obstetrics and Gynecology

## 2024-10-02 ENCOUNTER — Ambulatory Visit
Admission: RE | Admit: 2024-10-02 | Discharge: 2024-10-02 | Disposition: A | Payer: MEDICAID | Source: Ambulatory Visit | Attending: Obstetrics and Gynecology

## 2024-10-02 DIAGNOSIS — Z01419 Encounter for gynecological examination (general) (routine) without abnormal findings: Secondary | ICD-10-CM

## 2024-10-02 DIAGNOSIS — E2839 Other primary ovarian failure: Secondary | ICD-10-CM

## 2025-05-20 ENCOUNTER — Ambulatory Visit: Payer: MEDICAID | Admitting: Obstetrics and Gynecology
